# Patient Record
Sex: Male | Born: 1945 | Race: Black or African American | Hispanic: No | Marital: Married | Smoking: Former smoker
Health system: Southern US, Community
[De-identification: ages and names within clinical notes are randomized; demographics above are authoritative.]

## PROBLEM LIST (undated history)

## (undated) DIAGNOSIS — M1990 Unspecified osteoarthritis, unspecified site: Secondary | ICD-10-CM

## (undated) DIAGNOSIS — E039 Hypothyroidism, unspecified: Secondary | ICD-10-CM

## (undated) DIAGNOSIS — E7800 Pure hypercholesterolemia, unspecified: Secondary | ICD-10-CM

## (undated) DIAGNOSIS — I700 Atherosclerosis of aorta: Secondary | ICD-10-CM

## (undated) DIAGNOSIS — I10 Essential (primary) hypertension: Secondary | ICD-10-CM

## (undated) DIAGNOSIS — Z9889 Other specified postprocedural states: Secondary | ICD-10-CM

## (undated) DIAGNOSIS — E782 Mixed hyperlipidemia: Secondary | ICD-10-CM

## (undated) DIAGNOSIS — N183 Chronic kidney disease, stage 3 unspecified: Secondary | ICD-10-CM

## (undated) DIAGNOSIS — F419 Anxiety disorder, unspecified: Secondary | ICD-10-CM

## (undated) DIAGNOSIS — M47812 Spondylosis without myelopathy or radiculopathy, cervical region: Secondary | ICD-10-CM

## (undated) DIAGNOSIS — E119 Type 2 diabetes mellitus without complications: Secondary | ICD-10-CM

## (undated) DIAGNOSIS — R112 Nausea with vomiting, unspecified: Secondary | ICD-10-CM

## (undated) DIAGNOSIS — N400 Enlarged prostate without lower urinary tract symptoms: Secondary | ICD-10-CM

## (undated) HISTORY — DX: Type 2 diabetes mellitus without complications: E11.9

## (undated) HISTORY — DX: Anxiety disorder, unspecified: F41.9

## (undated) HISTORY — DX: Essential (primary) hypertension: I10

## (undated) HISTORY — DX: Unspecified osteoarthritis, unspecified site: M19.90

## (undated) HISTORY — DX: Atherosclerosis of aorta: I70.0

## (undated) HISTORY — DX: Hypothyroidism, unspecified: E03.9

## (undated) HISTORY — DX: Pure hypercholesterolemia, unspecified: E78.00

## (undated) HISTORY — DX: Benign prostatic hyperplasia without lower urinary tract symptoms: N40.0

## (undated) HISTORY — DX: Spondylosis without myelopathy or radiculopathy, cervical region: M47.812

## (undated) HISTORY — DX: Chronic kidney disease, stage 3 unspecified: N18.30

## (undated) HISTORY — PX: KNEE SURGERY: SHX244

## (undated) HISTORY — DX: Mixed hyperlipidemia: E78.2

## (undated) HISTORY — PX: BACK SURGERY: SHX140

---

## 2000-06-30 ENCOUNTER — Ambulatory Visit (HOSPITAL_COMMUNITY): Admission: RE | Admit: 2000-06-30 | Discharge: 2000-06-30 | Payer: Self-pay | Admitting: General Surgery

## 2001-03-02 ENCOUNTER — Encounter: Payer: Self-pay | Admitting: Internal Medicine

## 2001-03-02 ENCOUNTER — Ambulatory Visit (HOSPITAL_COMMUNITY): Admission: RE | Admit: 2001-03-02 | Discharge: 2001-03-02 | Payer: Self-pay | Admitting: Internal Medicine

## 2001-05-05 ENCOUNTER — Encounter: Payer: Self-pay | Admitting: *Deleted

## 2001-05-05 ENCOUNTER — Emergency Department (HOSPITAL_COMMUNITY): Admission: EM | Admit: 2001-05-05 | Discharge: 2001-05-05 | Payer: Self-pay | Admitting: *Deleted

## 2001-07-18 ENCOUNTER — Ambulatory Visit (HOSPITAL_COMMUNITY): Admission: RE | Admit: 2001-07-18 | Discharge: 2001-07-18 | Payer: Self-pay | Admitting: Internal Medicine

## 2001-07-18 ENCOUNTER — Encounter: Payer: Self-pay | Admitting: Internal Medicine

## 2003-11-22 ENCOUNTER — Ambulatory Visit (HOSPITAL_COMMUNITY): Admission: RE | Admit: 2003-11-22 | Discharge: 2003-11-22 | Payer: Self-pay | Admitting: Internal Medicine

## 2004-02-25 ENCOUNTER — Inpatient Hospital Stay (HOSPITAL_COMMUNITY): Admission: RE | Admit: 2004-02-25 | Discharge: 2004-02-28 | Payer: Self-pay | Admitting: Neurosurgery

## 2004-04-20 ENCOUNTER — Encounter (HOSPITAL_COMMUNITY): Admission: RE | Admit: 2004-04-20 | Discharge: 2004-05-20 | Payer: Self-pay | Admitting: Neurosurgery

## 2004-06-17 ENCOUNTER — Ambulatory Visit (HOSPITAL_COMMUNITY): Admission: RE | Admit: 2004-06-17 | Discharge: 2004-06-17 | Payer: Self-pay | Admitting: General Surgery

## 2005-06-23 ENCOUNTER — Ambulatory Visit (HOSPITAL_COMMUNITY): Admission: RE | Admit: 2005-06-23 | Discharge: 2005-06-23 | Payer: Self-pay | Admitting: Internal Medicine

## 2010-09-23 ENCOUNTER — Other Ambulatory Visit (HOSPITAL_COMMUNITY): Payer: Self-pay | Admitting: Internal Medicine

## 2010-09-23 DIAGNOSIS — M549 Dorsalgia, unspecified: Secondary | ICD-10-CM

## 2010-09-28 ENCOUNTER — Other Ambulatory Visit (HOSPITAL_COMMUNITY): Payer: Self-pay

## 2010-09-29 ENCOUNTER — Ambulatory Visit (HOSPITAL_COMMUNITY)
Admission: RE | Admit: 2010-09-29 | Discharge: 2010-09-29 | Disposition: A | Discharge status: Home / Self Care | Payer: Medicare Other | Source: Ambulatory Visit | Attending: Internal Medicine | Admitting: Internal Medicine

## 2010-09-29 DIAGNOSIS — M5126 Other intervertebral disc displacement, lumbar region: Secondary | ICD-10-CM | POA: Insufficient documentation

## 2010-09-29 DIAGNOSIS — M545 Low back pain, unspecified: Secondary | ICD-10-CM | POA: Insufficient documentation

## 2010-09-29 DIAGNOSIS — M549 Dorsalgia, unspecified: Secondary | ICD-10-CM

## 2014-11-20 ENCOUNTER — Other Ambulatory Visit (HOSPITAL_COMMUNITY): Payer: Self-pay | Admitting: Internal Medicine

## 2014-11-20 DIAGNOSIS — R109 Unspecified abdominal pain: Secondary | ICD-10-CM

## 2014-12-16 ENCOUNTER — Encounter (INDEPENDENT_AMBULATORY_CARE_PROVIDER_SITE_OTHER): Payer: Self-pay | Admitting: *Deleted

## 2015-01-22 ENCOUNTER — Encounter (INDEPENDENT_AMBULATORY_CARE_PROVIDER_SITE_OTHER): Payer: Self-pay | Admitting: Internal Medicine

## 2015-01-22 ENCOUNTER — Other Ambulatory Visit (INDEPENDENT_AMBULATORY_CARE_PROVIDER_SITE_OTHER): Payer: Self-pay | Admitting: Internal Medicine

## 2015-01-22 ENCOUNTER — Ambulatory Visit (INDEPENDENT_AMBULATORY_CARE_PROVIDER_SITE_OTHER): Payer: Medicare Other | Admitting: Internal Medicine

## 2015-01-22 ENCOUNTER — Telehealth (INDEPENDENT_AMBULATORY_CARE_PROVIDER_SITE_OTHER): Payer: Self-pay | Admitting: *Deleted

## 2015-01-22 DIAGNOSIS — Z1211 Encounter for screening for malignant neoplasm of colon: Secondary | ICD-10-CM | POA: Diagnosis not present

## 2015-01-22 DIAGNOSIS — I10 Essential (primary) hypertension: Secondary | ICD-10-CM | POA: Insufficient documentation

## 2015-01-22 DIAGNOSIS — R109 Unspecified abdominal pain: Secondary | ICD-10-CM

## 2015-01-22 DIAGNOSIS — E7800 Pure hypercholesterolemia, unspecified: Secondary | ICD-10-CM | POA: Insufficient documentation

## 2015-01-22 NOTE — Telephone Encounter (Signed)
Patient called in and his allergies are: Allegra, Cephalexin, Keflex

## 2015-01-22 NOTE — Progress Notes (Addendum)
Subjective:    Patient ID: Joseph Wolf, male    DOB: 12/26/1945, 70 y.o.   MRN: [id number]  Patient is allergic to Allegra and Keflex  HPI Referred by Dr. Gerarda Fraction [facility]) for abdominal pain/colonoscopy. Patient has never had a colonoscopy in the past.  He tells me two months ago he had lower abdominal pain and saw Dr Gerarda Fraction. He says he underwent a CT scan and was negative for diverticulitis. He was empirically treated he thinks with Cipro and Flagyl. The pain resolved. The pain lasted for about a week. He says he does have problems with constipation and takes Linzess. He usually has a BM every three days. No melena or BRRB. Appetite is good. No weight loss. He has acid reflux and is usually controlled with Omeprazole.   His last colonoscopy was in 2006 by Dr. Arnoldo Morale for hx of colonic polyps.  Small non bleeding internal hemorrhoids were present. Normal colon exam. Repeat in 10 yrs.     Review of Systems Past Medical History  Diagnosis Date  . High cholesterol   . Hypertension     Past Surgical History  Procedure Laterality Date  . Back surgery      2014. Has had four back surgeryes total  . Knee surgery      Left arthro    Not on File  No current outpatient prescriptions on file prior to visit.   No current facility-administered medications on file prior to visit.   No current outpatient prescriptions on file prior to visit.   No current facility-administered medications on file prior to visit.   Current Outpatient Prescriptions  Medication Sig Dispense Refill  . acetaminophen (TYLENOL) 325 MG tablet Take 650 mg by mouth every 6 (six) hours as needed.    Marland Kitchen amLODipine (NORVASC) 10 MG tablet Take 10 mg by mouth daily.    Marland Kitchen atenolol-chlorthalidone (TENORETIC) 100-25 MG tablet Take 1 tablet by mouth daily.    . cholecalciferol (VITAMIN D) 400 units TABS tablet Take 1,000 Units by mouth.    . cloNIDine (CATAPRES) 0.2 MG tablet Take 0.2 mg by mouth 2 (two) times  daily.    . Linaclotide (LINZESS) 145 MCG CAPS capsule Take 145 mcg by mouth daily.    . metFORMIN (GLUCOPHAGE) 850 MG tablet Take 850 mg by mouth 2 (two) times daily with a meal.    . omeprazole (PRILOSEC) 20 MG capsule Take 20 mg by mouth daily.    . potassium chloride (K-DUR) 10 MEQ tablet Take 20 mEq by mouth daily.    . rosuvastatin (CRESTOR) 20 MG tablet Take 20 mg by mouth daily.    . vitamin B-12 (CYANOCOBALAMIN) 1000 MCG tablet Take 1,000 mcg by mouth daily.     No current facility-administered medications for this visit.         Objective:   Physical Exam Blood pressure 112/76, pulse 60, temperature 98.1 F (36.7 C), height 5\' 9"  (1.753 m), weight 193 lb 3.2 oz (87.635 kg). Alert and oriented. Skin warm and dry. Oral mucosa is moist.   . Sclera anicteric, conjunctivae is pink. Thyroid not enlarged. No cervical lymphadenopathy. Lungs clear. Heart regular rate and rhythm.  Abdomen is soft. Bowel sounds are positive. No hepatomegaly. No abdominal masses felt. No tenderness.  No edema to lower extremities. Patient is alert and oriented.        Assessment & Plan:  Screening colonoscopy. The risks and benefits such as perforation, bleeding, and infection were reviewed with the  patient and is agreeable.

## 2015-01-22 NOTE — Patient Instructions (Signed)
The risks and benefits such as perforation, bleeding, and infection were reviewed with the patient and is agreeable. 

## 2015-01-22 NOTE — Telephone Encounter (Signed)
Patient needs trilyte 

## 2015-01-22 NOTE — Telephone Encounter (Signed)
noted 

## 2015-01-23 ENCOUNTER — Encounter (INDEPENDENT_AMBULATORY_CARE_PROVIDER_SITE_OTHER): Payer: Self-pay

## 2015-01-24 MED ORDER — PEG 3350-KCL-NA BICARB-NACL 420 G PO SOLR: 4000.0000 mL | Freq: Once | ORAL | Status: DC

## 2015-02-26 ENCOUNTER — Encounter (HOSPITAL_COMMUNITY)
Admission: RE | Disposition: A | Discharge status: Home / Self Care | Payer: Self-pay | Source: Ambulatory Visit | Attending: Internal Medicine

## 2015-02-26 ENCOUNTER — Ambulatory Visit (HOSPITAL_COMMUNITY)
Admission: RE | Admit: 2015-02-26 | Discharge: 2015-02-26 | Disposition: A | Discharge status: Home / Self Care | Payer: Medicare Other | Source: Ambulatory Visit | Attending: Internal Medicine | Admitting: Internal Medicine

## 2015-02-26 ENCOUNTER — Encounter (HOSPITAL_COMMUNITY): Payer: Self-pay

## 2015-02-26 DIAGNOSIS — E119 Type 2 diabetes mellitus without complications: Secondary | ICD-10-CM | POA: Insufficient documentation

## 2015-02-26 DIAGNOSIS — K649 Unspecified hemorrhoids: Secondary | ICD-10-CM | POA: Diagnosis not present

## 2015-02-26 DIAGNOSIS — Z7984 Long term (current) use of oral hypoglycemic drugs: Secondary | ICD-10-CM | POA: Diagnosis not present

## 2015-02-26 DIAGNOSIS — Z1211 Encounter for screening for malignant neoplasm of colon: Secondary | ICD-10-CM | POA: Insufficient documentation

## 2015-02-26 DIAGNOSIS — Z79899 Other long term (current) drug therapy: Secondary | ICD-10-CM | POA: Insufficient documentation

## 2015-02-26 DIAGNOSIS — K5730 Diverticulosis of large intestine without perforation or abscess without bleeding: Secondary | ICD-10-CM | POA: Diagnosis not present

## 2015-02-26 DIAGNOSIS — Q438 Other specified congenital malformations of intestine: Secondary | ICD-10-CM | POA: Insufficient documentation

## 2015-02-26 DIAGNOSIS — E7800 Pure hypercholesterolemia, unspecified: Secondary | ICD-10-CM | POA: Diagnosis not present

## 2015-02-26 DIAGNOSIS — Z87891 Personal history of nicotine dependence: Secondary | ICD-10-CM | POA: Diagnosis not present

## 2015-02-26 DIAGNOSIS — D122 Benign neoplasm of ascending colon: Secondary | ICD-10-CM | POA: Diagnosis not present

## 2015-02-26 DIAGNOSIS — K648 Other hemorrhoids: Secondary | ICD-10-CM | POA: Diagnosis not present

## 2015-02-26 DIAGNOSIS — I10 Essential (primary) hypertension: Secondary | ICD-10-CM | POA: Diagnosis not present

## 2015-02-26 HISTORY — PX: COLONOSCOPY: SHX5424

## 2015-02-26 HISTORY — DX: Other specified postprocedural states: R11.2

## 2015-02-26 HISTORY — DX: Other specified postprocedural states: Z98.890

## 2015-02-26 LAB — GLUCOSE, CAPILLARY: Glucose-Capillary: 145 mg/dL — ABNORMAL HIGH (ref 65–99)

## 2015-02-26 SURGERY — COLONOSCOPY
Anesthesia: Moderate Sedation

## 2015-02-26 MED ORDER — SODIUM CHLORIDE 0.9 % IV SOLN
INTRAVENOUS | Status: DC
  Administered 2015-02-26: 12:00:00 via INTRAVENOUS

## 2015-02-26 MED ORDER — MEPERIDINE HCL 50 MG/ML IJ SOLN
INTRAMUSCULAR | Status: AC
  Filled 2015-02-26: qty 1

## 2015-02-26 MED ORDER — MEPERIDINE HCL 50 MG/ML IJ SOLN
INTRAMUSCULAR | Status: DC | PRN
  Administered 2015-02-26 (×2): 25 mg via INTRAVENOUS

## 2015-02-26 MED ORDER — BENEFIBER DRINK MIX PO PACK: 4.0000 g | PACK | Freq: Every day | ORAL | Status: DC

## 2015-02-26 MED ORDER — MIDAZOLAM HCL 5 MG/5ML IJ SOLN
INTRAMUSCULAR | Status: AC
  Filled 2015-02-26: qty 10

## 2015-02-26 MED ORDER — MIDAZOLAM HCL 5 MG/5ML IJ SOLN
INTRAMUSCULAR | Status: DC | PRN
  Administered 2015-02-26 (×5): 2 mg via INTRAVENOUS

## 2015-02-26 MED ORDER — SIMETHICONE 40 MG/0.6ML PO SUSP
ORAL | Status: DC | PRN
  Administered 2015-02-26: 13:00:00

## 2015-02-26 NOTE — H&P (Addendum)
Joseph Wolf is an 70 y.o. male.   Chief Complaint:  Patient is  here for colonoscopy. HPI:   Patient is 70 year old African  American male who is here for screening colonoscopy. Last exam was in 2006. He denies rectal bleeding recent change in bowel habits. He has history of constipation and is on Linzess. Since he was treated for diverticulitis about 4 months ago and has fully recovered. Family history is negative for CRC.  Past Medical History  Diagnosis Date  . High cholesterol   . Hypertension   . PONV (postoperative nausea and vomiting)        DM  Past Surgical History  Procedure Laterality Date  . Back surgery      2014. Has had four back surgeryes total  . Knee surgery      Left arthro    Family History  Problem Relation Age of Onset  . Ulcerative colitis Paternal Grandmother    Social History:  reports that he has quit smoking. He does not have any smokeless tobacco history on file. He reports that he does not drink alcohol or use illicit drugs.  Allergies:  Allergies  Allergen Reactions  . Allegra [Fexofenadine]   . Keflex [Cephalexin]     Medications Prior to Admission  Medication Sig Dispense Refill  . acetaminophen (TYLENOL) 325 MG tablet Take 650 mg by mouth every 6 (six) hours as needed.    . ALPRAZolam (XANAX) 0.5 MG tablet Take 0.5 mg by mouth 3 (three) times daily as needed for anxiety.     Marland Kitchen amLODipine (NORVASC) 10 MG tablet Take 10 mg by mouth daily.    Marland Kitchen atenolol-chlorthalidone (TENORETIC) 100-25 MG tablet Take 1 tablet by mouth daily.    . cholecalciferol (VITAMIN D) 400 units TABS tablet Take 1,000 Units by mouth.    . cloNIDine (CATAPRES) 0.2 MG tablet Take 0.2 mg by mouth 2 (two) times daily.    . Linaclotide (LINZESS) 145 MCG CAPS capsule Take 145 mcg by mouth daily.    . metFORMIN (GLUCOPHAGE) 850 MG tablet Take 850 mg by mouth 2 (two) times daily with a meal.    . omeprazole (PRILOSEC) 20 MG capsule Take 20 mg by mouth daily.    . polyethylene  glycol-electrolytes (NULYTELY/GOLYTELY) 420 g solution Take 4,000 mLs by mouth once. 4000 mL 0  . potassium chloride (K-DUR) 10 MEQ tablet Take 20 mEq by mouth daily.    . potassium chloride SA (K-DUR,KLOR-CON) 20 MEQ tablet TK 1 T PO QD  5  . rosuvastatin (CRESTOR) 20 MG tablet Take 20 mg by mouth daily.    . temazepam (RESTORIL) 30 MG capsule Take 30 mg by mouth at bedtime.     . vitamin B-12 (CYANOCOBALAMIN) 1000 MCG tablet Take 1,000 mcg by mouth daily.      No results found for this or any previous visit (from the past 48 hour(s)). No results found.  ROS  Blood pressure 161/90, pulse 67, temperature 98.1 F (36.7 C), temperature source Oral, resp. rate 17, height 5\' 9"  (1.753 m), weight 193 lb (87.544 kg), SpO2 100 %. Physical Exam  Constitutional: He appears well-developed and well-nourished.  HENT:  Mouth/Throat: Oropharynx is clear and moist.  Eyes: Conjunctivae are normal. No scleral icterus.  Neck: No thyromegaly present.  Cardiovascular: Normal rate, regular rhythm and normal heart sounds.   No murmur heard. Respiratory: Effort normal and breath sounds normal.  GI: Soft. He exhibits no distension and no mass. There is no tenderness.  Musculoskeletal: He exhibits no edema.  Lymphadenopathy:    He has no cervical adenopathy.  Neurological: He is alert.  Skin: Skin is warm and dry.     Assessment/Plan  Average risk screening colonoscopy.  Rogene Houston, MD 02/26/2015, 1:01 PM

## 2015-02-26 NOTE — Discharge Instructions (Signed)
Colonoscopy, Care After °Refer to this sheet in the next few weeks. These instructions provide you with information on caring for yourself after your procedure. Your health care provider may also give you more specific instructions. Your treatment has been planned according to current medical practices, but problems sometimes occur. Call your health care provider if you have any problems or questions after your procedure. °WHAT TO EXPECT AFTER THE PROCEDURE  °After your procedure, it is typical to have the following: °· A small amount of blood in your stool. °· Moderate amounts of gas and mild abdominal cramping or bloating. °HOME CARE INSTRUCTIONS °· Do not drive, operate machinery, or sign important documents for 24 hours. °· You may shower and resume your regular physical activities, but move at a slower pace for the first 24 hours. °· Take frequent rest periods for the first 24 hours. °· Walk around or put a warm pack on your abdomen to help reduce abdominal cramping and bloating. °· Drink enough fluids to keep your urine clear or pale yellow. °· You may resume your normal diet as instructed by your health care provider. Avoid heavy or fried foods that are hard to digest. °· Avoid drinking alcohol for 24 hours or as instructed by your health care provider. °· Only take over-the-counter or prescription medicines as directed by your health care provider. °· If a tissue sample (biopsy) was taken during your procedure: °¨ Do not take aspirin or blood thinners for 7 days, or as instructed by your health care provider. °¨ Do not drink alcohol for 7 days, or as instructed by your health care provider. °¨ Eat soft foods for the first 24 hours. °SEEK MEDICAL CARE IF: °You have persistent spotting of blood in your stool 2-3 days after the procedure. °SEEK IMMEDIATE MEDICAL CARE IF: °· You have more than a small spotting of blood in your stool. °· You pass large blood clots in your stool. °· Your abdomen is swollen  (distended). °· You have nausea or vomiting. °· You have a fever. °· You have increasing abdominal pain that is not relieved with medicine. °  °This information is not intended to replace advice given to you by your health care provider. Make sure you discuss any questions you have with your health care provider. °  °Document Released: 08/19/2003 Document Revised: 10/25/2012 Document Reviewed: 09/11/2012 °Elsevier Interactive Patient Education ©2016 [employer] ° °

## 2015-02-26 NOTE — Op Note (Signed)
COLONOSCOPY PROCEDURE REPORT  PATIENT:  JESPER GASSNER  MR#:  [id number] Birthdate:  [birth year], 70 y.o., male Endoscopist:  Dr. Rogene Houston, MD Referred By:  Dr. Glo Herring, MD Procedure Date: 02/26/2015  Procedure:   Colonoscopy  Indications: Patient is 70 year old African male who is undergoing average risk screening colonoscopy. Last exam was in 2006. Patient says he was treated for diverticulitis about 4 months ago. Presently he is asymptomatic other than chronic constipation for which she is taking Linzess.  Informed Consent:  The procedure and risks were reviewed with the patient and informed consent was obtained.  Medications:  Demerol 50 mg IV Versed 10 mg IV  First dose administered at 1305 Last dose administered at 61  Description of procedure:  After a digital rectal exam was performed, that colonoscope was advanced from the anus through the rectum and colon to the area of the cecum, ileocecal valve and appendiceal orifice. The cecum was deeply intubated. These structures were well-seen and photographed for the record. From the level of the cecum and ileocecal valve, the scope was slowly and cautiously withdrawn. The mucosal surfaces were carefully surveyed utilizing scope tip to flexion to facilitate fold flattening as needed. The scope was pulled down into the rectum where a thorough exam including retroflexion was performed.  Findings:   Prep satisfactory. Redundant colon with scattered diverticula at sigmoid colon and hepatic flexure. 4 mm polyp cold snared from ascending colon. Normal rectal mucosa. Prominent hemorrhoids noted below the dentate line.    Therapeutic/Diagnostic Maneuvers Performed:  See above  Complications:  None  EBL:  Minimal  Cecal Withdrawal Time:  13 minutes  Impression:  Examination performed to cecum. Small polyp cold snare from ascending colon. Scattered diverticula at ascending and sigmoid colon. External  hemorrhoids.  Recommendations:  Standard instructions given. Benefiber 4 g by mouth daily at bedtime. I will contact patient with biopsy results and further recommendations.  Phallon Haydu U  02/26/2015 2:40 PM  CC: Dr. Glo Herring., MD & Dr. Rayne Du ref. provider found

## 2015-03-05 ENCOUNTER — Encounter (HOSPITAL_COMMUNITY): Payer: Self-pay | Admitting: Internal Medicine

## 2018-08-07 ENCOUNTER — Other Ambulatory Visit: Payer: Medicare Other

## 2018-08-07 DIAGNOSIS — Z20822 Contact with and (suspected) exposure to covid-19: Secondary | ICD-10-CM

## 2018-08-10 LAB — NOVEL CORONAVIRUS, NAA: SARS-CoV-2, NAA: NOT DETECTED

## 2018-11-27 ENCOUNTER — Other Ambulatory Visit: Payer: Self-pay

## 2018-11-27 ENCOUNTER — Encounter (HOSPITAL_COMMUNITY): Payer: Self-pay | Admitting: Emergency Medicine

## 2018-11-27 ENCOUNTER — Emergency Department (HOSPITAL_COMMUNITY): Payer: Medicare Other

## 2018-11-27 ENCOUNTER — Inpatient Hospital Stay (HOSPITAL_COMMUNITY)
Admission: EM | Admit: 2018-11-27 | Discharge: 2018-12-01 | DRG: 645 | Disposition: A | Discharge status: Home Health | Payer: Medicare Other | Attending: Internal Medicine | Admitting: Internal Medicine

## 2018-11-27 ENCOUNTER — Inpatient Hospital Stay: Payer: Self-pay

## 2018-11-27 DIAGNOSIS — K219 Gastro-esophageal reflux disease without esophagitis: Secondary | ICD-10-CM | POA: Diagnosis present

## 2018-11-27 DIAGNOSIS — E1165 Type 2 diabetes mellitus with hyperglycemia: Secondary | ICD-10-CM | POA: Diagnosis present

## 2018-11-27 DIAGNOSIS — Z20828 Contact with and (suspected) exposure to other viral communicable diseases: Secondary | ICD-10-CM | POA: Diagnosis present

## 2018-11-27 DIAGNOSIS — T380X5A Adverse effect of glucocorticoids and synthetic analogues, initial encounter: Secondary | ICD-10-CM | POA: Diagnosis present

## 2018-11-27 DIAGNOSIS — E871 Hypo-osmolality and hyponatremia: Secondary | ICD-10-CM

## 2018-11-27 DIAGNOSIS — Z7984 Long term (current) use of oral hypoglycemic drugs: Secondary | ICD-10-CM

## 2018-11-27 DIAGNOSIS — E785 Hyperlipidemia, unspecified: Secondary | ICD-10-CM | POA: Diagnosis present

## 2018-11-27 DIAGNOSIS — Z87891 Personal history of nicotine dependence: Secondary | ICD-10-CM | POA: Diagnosis not present

## 2018-11-27 DIAGNOSIS — E7800 Pure hypercholesterolemia, unspecified: Secondary | ICD-10-CM | POA: Diagnosis present

## 2018-11-27 DIAGNOSIS — R945 Abnormal results of liver function studies: Secondary | ICD-10-CM | POA: Diagnosis not present

## 2018-11-27 DIAGNOSIS — E222 Syndrome of inappropriate secretion of antidiuretic hormone: Principal | ICD-10-CM | POA: Diagnosis present

## 2018-11-27 DIAGNOSIS — Z79899 Other long term (current) drug therapy: Secondary | ICD-10-CM

## 2018-11-27 DIAGNOSIS — E876 Hypokalemia: Secondary | ICD-10-CM | POA: Diagnosis present

## 2018-11-27 DIAGNOSIS — I10 Essential (primary) hypertension: Secondary | ICD-10-CM | POA: Diagnosis present

## 2018-11-27 DIAGNOSIS — R531 Weakness: Secondary | ICD-10-CM | POA: Diagnosis present

## 2018-11-27 DIAGNOSIS — Z888 Allergy status to other drugs, medicaments and biological substances status: Secondary | ICD-10-CM | POA: Diagnosis not present

## 2018-11-27 DIAGNOSIS — E1169 Type 2 diabetes mellitus with other specified complication: Secondary | ICD-10-CM

## 2018-11-27 DIAGNOSIS — B029 Zoster without complications: Secondary | ICD-10-CM

## 2018-11-27 DIAGNOSIS — E861 Hypovolemia: Secondary | ICD-10-CM | POA: Diagnosis present

## 2018-11-27 DIAGNOSIS — E119 Type 2 diabetes mellitus without complications: Secondary | ICD-10-CM

## 2018-11-27 DIAGNOSIS — Z8379 Family history of other diseases of the digestive system: Secondary | ICD-10-CM | POA: Diagnosis not present

## 2018-11-27 DIAGNOSIS — Z79891 Long term (current) use of opiate analgesic: Secondary | ICD-10-CM | POA: Diagnosis not present

## 2018-11-27 LAB — CBC
HCT: 37.3 % — ABNORMAL LOW (ref 39.0–52.0)
Hemoglobin: 13.9 g/dL (ref 13.0–17.0)
MCH: 30.9 pg (ref 26.0–34.0)
MCHC: 37.3 g/dL — ABNORMAL HIGH (ref 30.0–36.0)
MCV: 82.9 fL (ref 80.0–100.0)
Platelets: 278 10*3/uL (ref 150–400)
RBC: 4.5 MIL/uL (ref 4.22–5.81)
RDW: 11.3 % — ABNORMAL LOW (ref 11.5–15.5)
WBC: 17 10*3/uL — ABNORMAL HIGH (ref 4.0–10.5)
nRBC: 0 % (ref 0.0–0.2)

## 2018-11-27 LAB — URINALYSIS, ROUTINE W REFLEX MICROSCOPIC
Bacteria, UA: NONE SEEN
Bilirubin Urine: NEGATIVE
Glucose, UA: 150 mg/dL — AB
Ketones, ur: NEGATIVE mg/dL
Leukocytes,Ua: NEGATIVE
Nitrite: NEGATIVE
Protein, ur: 100 mg/dL — AB
Specific Gravity, Urine: 1.006 (ref 1.005–1.030)
pH: 7 (ref 5.0–8.0)

## 2018-11-27 LAB — BASIC METABOLIC PANEL
Anion gap: 17 — ABNORMAL HIGH (ref 5–15)
BUN: 14 mg/dL (ref 8–23)
CO2: 22 mmol/L (ref 22–32)
Calcium: 8.5 mg/dL — ABNORMAL LOW (ref 8.9–10.3)
Chloride: 68 mmol/L — ABNORMAL LOW (ref 98–111)
Creatinine, Ser: 0.97 mg/dL (ref 0.61–1.24)
GFR calc Af Amer: >60 mL/min (ref >60)
GFR calc non Af Amer: >60 mL/min (ref >60)
Glucose, Bld: 227 mg/dL — ABNORMAL HIGH (ref 70–99)
Potassium: 3.1 mmol/L — ABNORMAL LOW (ref 3.5–5.1)
Sodium: 107 mmol/L — CL (ref 135–145)

## 2018-11-27 LAB — COMPREHENSIVE METABOLIC PANEL
ALT: 53 U/L — ABNORMAL HIGH (ref 0–44)
AST: 146 U/L — ABNORMAL HIGH (ref 15–41)
Albumin: 4.1 g/dL (ref 3.5–5.0)
Alkaline Phosphatase: 58 U/L (ref 38–126)
Anion gap: 16 — ABNORMAL HIGH (ref 5–15)
BUN: 15 mg/dL (ref 8–23)
CO2: 25 mmol/L (ref 22–32)
Calcium: 8.9 mg/dL (ref 8.9–10.3)
Chloride: 66 mmol/L — ABNORMAL LOW (ref 98–111)
Creatinine, Ser: 0.85 mg/dL (ref 0.61–1.24)
GFR calc Af Amer: >60 mL/min (ref >60)
GFR calc non Af Amer: >60 mL/min (ref >60)
Glucose, Bld: 268 mg/dL — ABNORMAL HIGH (ref 70–99)
Potassium: 2.7 mmol/L — CL (ref 3.5–5.1)
Sodium: 107 mmol/L — CL (ref 135–145)
Total Bilirubin: 1.8 mg/dL — ABNORMAL HIGH (ref 0.3–1.2)
Total Protein: 7.3 g/dL (ref 6.5–8.1)

## 2018-11-27 LAB — HEMOGLOBIN A1C
Hgb A1c MFr Bld: 6.8 % — ABNORMAL HIGH (ref 4.8–5.6)
Mean Plasma Glucose: 148.46 mg/dL

## 2018-11-27 LAB — CBG MONITORING, ED
Glucose-Capillary: 262 mg/dL — ABNORMAL HIGH (ref 70–99)
Glucose-Capillary: 214 mg/dL — ABNORMAL HIGH (ref 70–99)

## 2018-11-27 LAB — SODIUM, URINE, RANDOM: Sodium, Ur: 13 mmol/L

## 2018-11-27 LAB — SODIUM
Sodium: 111 mmol/L — CL (ref 135–145)
Sodium: 112 mmol/L — CL (ref 135–145)
Sodium: 113 mmol/L — CL (ref 135–145)
Sodium: 116 mmol/L — CL (ref 135–145)
Sodium: 116 mmol/L — CL (ref 135–145)

## 2018-11-27 LAB — OSMOLALITY: Osmolality: 235 mOsm/kg — CL (ref 275–295)

## 2018-11-27 LAB — TSH: TSH: 0.775 u[IU]/mL (ref 0.350–4.500)

## 2018-11-27 LAB — OSMOLALITY, URINE: Osmolality, Ur: 381 mOsm/kg (ref 300–900)

## 2018-11-27 LAB — MAGNESIUM: Magnesium: 1.5 mg/dL — ABNORMAL LOW (ref 1.7–2.4)

## 2018-11-27 LAB — LIPASE, BLOOD: Lipase: 19 U/L (ref 11–51)

## 2018-11-27 LAB — STREP PNEUMONIAE URINARY ANTIGEN: Strep Pneumo Urinary Antigen: NEGATIVE

## 2018-11-27 LAB — PROCALCITONIN: Procalcitonin: <0.1 ng/mL

## 2018-11-27 MED ORDER — SODIUM CHLORIDE 3 % IV SOLN
INTRAVENOUS | Status: DC
  Administered 2018-11-27: 30 mL/h via INTRAVENOUS
  Filled 2018-11-27 (×3): qty 500

## 2018-11-27 MED ORDER — CLONIDINE HCL 0.2 MG PO TABS
0.2000 mg | ORAL_TABLET | Freq: Two times a day (BID) | ORAL | Status: DC
  Administered 2018-11-27 – 2018-12-01 (×8): 0.2 mg via ORAL
  Filled 2018-11-27 (×8): qty 1

## 2018-11-27 MED ORDER — ROSUVASTATIN CALCIUM 20 MG PO TABS
20.0000 mg | ORAL_TABLET | Freq: Every day | ORAL | Status: DC
  Administered 2018-11-28 – 2018-11-30 (×3): 20 mg via ORAL
  Filled 2018-11-27 (×5): qty 1

## 2018-11-27 MED ORDER — POTASSIUM CHLORIDE 10 MEQ/100ML IV SOLN
10.0000 meq | Freq: Once | INTRAVENOUS | Status: AC
  Administered 2018-11-27: 10 meq via INTRAVENOUS
  Filled 2018-11-27: qty 100

## 2018-11-27 MED ORDER — ONDANSETRON HCL 4 MG/2ML IJ SOLN
4.0000 mg | Freq: Once | INTRAMUSCULAR | Status: AC
  Administered 2018-11-27: 4 mg via INTRAVENOUS
  Filled 2018-11-27: qty 2

## 2018-11-27 MED ORDER — ENOXAPARIN SODIUM 40 MG/0.4ML ~~LOC~~ SOLN
40.0000 mg | SUBCUTANEOUS | Status: DC
  Administered 2018-11-27 – 2018-11-30 (×4): 40 mg via SUBCUTANEOUS
  Filled 2018-11-27 (×4): qty 0.4

## 2018-11-27 MED ORDER — ALPRAZOLAM 0.5 MG PO TABS
0.5000 mg | ORAL_TABLET | Freq: Three times a day (TID) | ORAL | Status: DC | PRN
  Administered 2018-11-29 – 2018-12-01 (×4): 0.5 mg via ORAL
  Filled 2018-11-27 (×4): qty 1

## 2018-11-27 MED ORDER — VITAMIN D 25 MCG (1000 UNIT) PO TABS
1000.0000 [IU] | ORAL_TABLET | Freq: Every day | ORAL | Status: DC
  Administered 2018-11-28 – 2018-12-01 (×4): 1000 [IU] via ORAL
  Filled 2018-11-27 (×12): qty 1

## 2018-11-27 MED ORDER — TEMAZEPAM 15 MG PO CAPS
30.0000 mg | ORAL_CAPSULE | Freq: Every day | ORAL | Status: DC
  Administered 2018-11-27 – 2018-11-30 (×4): 30 mg via ORAL
  Filled 2018-11-27 (×4): qty 2

## 2018-11-27 MED ORDER — PANTOPRAZOLE SODIUM 40 MG PO TBEC
40.0000 mg | DELAYED_RELEASE_TABLET | Freq: Every day | ORAL | Status: DC
  Administered 2018-11-27 – 2018-12-01 (×5): 40 mg via ORAL
  Filled 2018-11-27 (×5): qty 1

## 2018-11-27 MED ORDER — ONDANSETRON HCL 4 MG/2ML IJ SOLN: 4.0000 mg | Freq: Four times a day (QID) | INTRAMUSCULAR | Status: DC | PRN

## 2018-11-27 MED ORDER — SODIUM CHLORIDE 0.9 % IV BOLUS
1000.0000 mL | Freq: Once | INTRAVENOUS | Status: AC
  Administered 2018-11-27: 1000 mL via INTRAVENOUS

## 2018-11-27 MED ORDER — ATENOLOL 25 MG PO TABS
100.0000 mg | ORAL_TABLET | Freq: Every day | ORAL | Status: DC
  Administered 2018-11-27 – 2018-12-01 (×5): 100 mg via ORAL
  Filled 2018-11-27 (×5): qty 4

## 2018-11-27 MED ORDER — SODIUM CHLORIDE 0.9 % IV SOLN
500.0000 mg | INTRAVENOUS | Status: DC
  Administered 2018-11-27 – 2018-11-28 (×2): 500 mg via INTRAVENOUS
  Filled 2018-11-27 (×2): qty 500

## 2018-11-27 MED ORDER — POTASSIUM CHLORIDE 10 MEQ/100ML IV SOLN
10.0000 meq | INTRAVENOUS | Status: AC
  Administered 2018-11-27 (×4): 10 meq via INTRAVENOUS
  Filled 2018-11-27 (×4): qty 100

## 2018-11-27 MED ORDER — INSULIN ASPART 100 UNIT/ML ~~LOC~~ SOLN
0.0000 [IU] | Freq: Three times a day (TID) | SUBCUTANEOUS | Status: DC
  Administered 2018-11-28 – 2018-11-29 (×3): 1 [IU] via SUBCUTANEOUS
  Administered 2018-11-29: 2 [IU] via SUBCUTANEOUS
  Administered 2018-11-30 (×2): 1 [IU] via SUBCUTANEOUS
  Administered 2018-11-30: 3 [IU] via SUBCUTANEOUS
  Administered 2018-12-01: 5 [IU] via SUBCUTANEOUS
  Administered 2018-12-01: 2 [IU] via SUBCUTANEOUS

## 2018-11-27 MED ORDER — AMLODIPINE BESYLATE 5 MG PO TABS
10.0000 mg | ORAL_TABLET | Freq: Every day | ORAL | Status: DC
  Administered 2018-11-28 – 2018-12-01 (×4): 10 mg via ORAL
  Filled 2018-11-27 (×4): qty 2

## 2018-11-27 MED ORDER — ACETAMINOPHEN 325 MG PO TABS: 650.0000 mg | ORAL_TABLET | Freq: Four times a day (QID) | ORAL | Status: DC | PRN

## 2018-11-27 MED ORDER — HYDROCODONE-ACETAMINOPHEN 10-325 MG PO TABS
1.0000 | ORAL_TABLET | Freq: Four times a day (QID) | ORAL | Status: DC | PRN
  Administered 2018-11-28: 21:00:00 1 via ORAL
  Filled 2018-11-27: qty 1

## 2018-11-27 MED ORDER — POTASSIUM CHLORIDE CRYS ER 20 MEQ PO TBCR
40.0000 meq | EXTENDED_RELEASE_TABLET | Freq: Once | ORAL | Status: AC
  Administered 2018-11-27: 40 meq via ORAL
  Filled 2018-11-27: qty 2

## 2018-11-27 MED ORDER — SODIUM CHLORIDE 0.9 % IV SOLN
1.0000 g | INTRAVENOUS | Status: DC
  Administered 2018-11-27 – 2018-11-28 (×2): 1 g via INTRAVENOUS
  Filled 2018-11-27 (×2): qty 10

## 2018-11-27 MED ORDER — VALACYCLOVIR HCL 500 MG PO TABS
1000.0000 mg | ORAL_TABLET | Freq: Three times a day (TID) | ORAL | Status: DC
  Administered 2018-11-27 – 2018-12-01 (×12): 1000 mg via ORAL
  Filled 2018-11-27 (×14): qty 2

## 2018-11-27 MED ORDER — SODIUM CHLORIDE 0.9 % IV SOLN
INTRAVENOUS | Status: DC | PRN
  Administered 2018-11-27: 500 mL via INTRAVENOUS

## 2018-11-27 MED ORDER — INSULIN ASPART 100 UNIT/ML ~~LOC~~ SOLN
0.0000 [IU] | Freq: Every day | SUBCUTANEOUS | Status: DC
  Administered 2018-11-27 – 2018-11-30 (×2): 2 [IU] via SUBCUTANEOUS
  Filled 2018-11-27: qty 1

## 2018-11-27 MED ORDER — VITAMIN B-12 1000 MCG PO TABS
1000.0000 ug | ORAL_TABLET | Freq: Every day | ORAL | Status: DC
  Administered 2018-11-28 – 2018-12-01 (×4): 1000 ug via ORAL
  Filled 2018-11-27 (×7): qty 1

## 2018-11-27 MED ORDER — ONDANSETRON HCL 4 MG PO TABS: 4.0000 mg | ORAL_TABLET | Freq: Four times a day (QID) | ORAL | Status: DC | PRN

## 2018-11-27 MED ORDER — LINACLOTIDE 145 MCG PO CAPS
145.0000 ug | ORAL_CAPSULE | Freq: Every day | ORAL | Status: DC
  Administered 2018-11-28 – 2018-12-01 (×4): 145 ug via ORAL
  Filled 2018-11-27 (×7): qty 1

## 2018-11-27 MED ORDER — ACETAMINOPHEN 650 MG RE SUPP: 650.0000 mg | Freq: Four times a day (QID) | RECTAL | Status: DC | PRN

## 2018-11-27 MED ORDER — BENZONATATE 100 MG PO CAPS: 100.0000 mg | ORAL_CAPSULE | Freq: Four times a day (QID) | ORAL | Status: DC | PRN

## 2018-11-27 NOTE — ED Notes (Signed)
Pt with emesis last night but with nausea many days.  LBM the other day and normal per pt.

## 2018-11-27 NOTE — ED Notes (Addendum)
CRITICAL VALUE ALERT  Critical Value:  Sodium 116  Date & Time Notied:  11/27/2018 2325  Provider Notified: [email or web address]  Orders Received/Actions taken: keep hypertonic fluids at 30mg /hr

## 2018-11-27 NOTE — ED Notes (Signed)
CRITICAL VALUE ALERT  Critical Value:  Sodium 107  Date & Time Notied:  11/27/18 1525  Provider Notified: Dr. Manuella Ghazi  Orders Received/Actions taken: MD notified

## 2018-11-27 NOTE — ED Notes (Signed)
ED Provider at bedside. 

## 2018-11-27 NOTE — H&P (Addendum)
History and Physical    Joseph Wolf [id number] DOB: 01/01/1946 DOA: 11/27/2018  PCP: Redmond School, MD   Patient coming from: Home  Chief Complaint: Melton Alar  HPI: Joseph Wolf is a 73 y.o. male with medical history significant for hypertension, dyslipidemia, and recent diagnosis of shingles who presented to the emergency department with weakness and dizziness over the last couple days as well as an episode of vomiting earlier today.  He was recently noted to have a rash over his scalp as well as some blistering on his lower lip and was diagnosed with shingles and started on Valtrex as well as steroid pack and given some narcotic pain medications.  He is good about taking his home medications which includes antihypertensives as well as a diuretic, chlorthalidone.  Since he has not been feeling too well he has been drinking some increased amount of fluids in the last 2 days.  This includes about 3 bottles of water as well as some [employer] and other beverages.  He continued to feel weak for which he presented to the ED.  He denies any fevers, chills, shortness of breath, chest pain or any other symptoms.   ED Course: Vital signs are stable, but patient is noted to be hypertensive.  His sodium is quite remarkable at 107 and potassium is 2.7.  He is also noted to have leukocytosis at 17,000.  I have discussed with his PCP office his prior sodium was last checked in 2017 and was noted to be 135 and there were no other recent labs available.  Glucose is 268.  EKG with sinus rhythm at 95 bpm.  Chest x-ray with left lower lobe infiltrate noted versus atelectasis.  He has been bolused 1 L normal saline and given 20 mEq of IV potassium in the ED.  Review of Systems: All others reviewed and otherwise negative except as noted above.  Past Medical History:  Diagnosis Date  . High cholesterol   . Hypertension   . PONV (postoperative nausea and vomiting)     Past Surgical History:   Procedure Laterality Date  . BACK SURGERY     2014. Has had four back surgeryes total  . COLONOSCOPY N/A 02/26/2015   Procedure: COLONOSCOPY;  Surgeon: Rogene Houston, MD;  Location: [facility];  Service: Endoscopy;  Laterality: N/A;  1:00  . KNEE SURGERY     Left arthro     reports that he has quit smoking. He does not have any smokeless tobacco history on file. He reports that he does not drink alcohol or use drugs.  Allergies  Allergen Reactions  . Allegra [Fexofenadine]   . Keflex [Cephalexin]     Family History  Problem Relation Age of Onset  . Ulcerative colitis Paternal Grandmother     Prior to Admission medications   Medication Sig Start Date End Date Taking? Authorizing Provider  acetaminophen (TYLENOL) 325 MG tablet Take 650 mg by mouth every 6 (six) hours as needed.   Yes [provider]  ALPRAZolam Duanne Moron) 0.5 MG tablet Take 0.5 mg by mouth 3 (three) times daily as needed for anxiety.  01/08/15  Yes [provider]  amLODipine (NORVASC) 10 MG tablet Take 10 mg by mouth daily.   Yes [provider]  atenolol-chlorthalidone (TENORETIC) 100-25 MG tablet Take 1 tablet by mouth daily.   Yes [provider]  benzonatate (TESSALON) 100 MG capsule Take 100 mg by mouth 4 (four) times daily as needed. 11/21/18  Yes  [provider]  cholecalciferol (VITAMIN D) 400 units TABS tablet Take 1,000 Units by mouth.   Yes [provider]  cloNIDine (CATAPRES) 0.2 MG tablet Take 0.2 mg by mouth 2 (two) times daily.   Yes [provider]  HYDROcodone-acetaminophen (NORCO) 10-325 MG tablet Take 1-2 tablets by mouth 4 (four) times daily as needed. 11/21/18  Yes [provider]  Linaclotide (LINZESS) 145 MCG CAPS capsule Take 145 mcg by mouth daily.   Yes [provider]  metFORMIN (GLUCOPHAGE) 850 MG tablet Take 850 mg by mouth 2 (two) times daily with a meal.   Yes [provider]  methylPREDNISolone  (MEDROL DOSEPAK) 4 MG TBPK tablet Take 1 tablet by mouth daily. 6,5,4,3,2,1 11/21/18  Yes [provider]  omeprazole (PRILOSEC) 20 MG capsule Take 20 mg by mouth daily.   Yes [provider]  potassium chloride SA (K-DUR,KLOR-CON) 20 MEQ tablet TK 1 T PO QD 01/23/15  Yes [provider]  rosuvastatin (CRESTOR) 20 MG tablet Take 20 mg by mouth daily.   Yes [provider]  temazepam (RESTORIL) 30 MG capsule Take 30 mg by mouth at bedtime.  12/20/14  Yes [provider]  valACYclovir (VALTREX) 1000 MG tablet Take 1,000 mg by mouth 3 (three) times daily. 11/21/18  Yes [provider]  vitamin B-12 (CYANOCOBALAMIN) 1000 MCG tablet Take 1,000 mcg by mouth daily.   Yes [provider]  Wheat Dextrin (BENEFIBER DRINK MIX) PACK Take 4 g by mouth at bedtime. Patient not taking: Reported on 11/27/2018 02/26/15   Rogene Houston, MD    Physical Exam: Vitals:   11/27/18 1250 11/27/18 1315 11/27/18 1345 11/27/18 1350  BP:      Pulse: 92 94 88 89  Resp: (!) 22 15 18  (!) 21  Temp:      TempSrc:      SpO2: 98% 100% 100% 100%  Weight:      Height:        Constitutional: NAD, calm, comfortable Vitals:   11/27/18 1250 11/27/18 1315 11/27/18 1345 11/27/18 1350  BP:      Pulse: 92 94 88 89  Resp: (!) 22 15 18  (!) 21  Temp:      TempSrc:      SpO2: 98% 100% 100% 100%  Weight:      Height:       Eyes: lids and conjunctivae normal ENMT: Mucous membranes are moist.  Neck: normal, supple Respiratory: clear to auscultation bilaterally. Normal respiratory effort. No accessory muscle use.  Cardiovascular: Regular rate and rhythm, no murmurs. No extremity edema. Abdomen: no tenderness, no distention. Bowel sounds positive.  Musculoskeletal:  No joint deformity upper and lower extremities.   Skin: no rashes, lesions, ulcers.  Psychiatric: Flat affect  Labs on Admission: I have personally reviewed following labs and imaging studies  CBC:  Recent Labs  Lab 11/27/18 1116  WBC 17.0*  HGB 13.9  HCT 37.3*  MCV 82.9  PLT 0000000   Basic Metabolic Panel: Recent Labs  Lab 11/27/18 1116  NA 107*  K 2.7*  CL 66*  CO2 25  GLUCOSE 268*  BUN 15  CREATININE 0.85  CALCIUM 8.9   GFR: Estimated Creatinine Clearance: 82.3 mL/min (by C-G formula based on SCr of 0.85 mg/dL). Liver Function Tests: Recent Labs  Lab 11/27/18 1116  AST 146*  ALT 53*  ALKPHOS 58  BILITOT 1.8*  PROT 7.3  ALBUMIN 4.1   Recent Labs  Lab 11/27/18 1116  LIPASE 19   No results for input(s): AMMONIA in the last 168 hours. Coagulation Profile: No results for input(s): INR, PROTIME in the last 168 hours. Cardiac Enzymes: No results for input(s): CKTOTAL, CKMB, CKMBINDEX, TROPONINI in the last 168 hours. BNP (last 3 results) No results for input(s): PROBNP in the last 8760 hours. HbA1C: No results for input(s): HGBA1C in the last 72 hours. CBG: Recent Labs  Lab 11/27/18 1327  GLUCAP 262*   Lipid Profile: No results for input(s): CHOL, HDL, LDLCALC, TRIG, CHOLHDL, LDLDIRECT in the last 72 hours. Thyroid Function Tests: No results for input(s): TSH, T4TOTAL, FREET4, T3FREE, THYROIDAB in the last 72 hours. Anemia Panel: No results for input(s): VITAMINB12, FOLATE, FERRITIN, TIBC, IRON, RETICCTPCT in the last 72 hours. Urine analysis: No results found for: COLORURINE, APPEARANCEUR, LABSPEC, Varina, GLUCOSEU, HGBUR, BILIRUBINUR, KETONESUR, PROTEINUR, UROBILINOGEN, NITRITE, LEUKOCYTESUR  Radiological Exams on Admission: Dg Chest Portable 1 View  Result Date: 11/27/2018 CLINICAL DATA:  Weakness with vomiting and dizziness EXAM: PORTABLE CHEST 1 VIEW COMPARISON:  None. FINDINGS: Lordotic projection. Negative for heart failure. Cervical ACDF fusion plate. Possible left lower lobe atelectasis/infiltrate. This is not well evaluated on the current projection. IMPRESSION: Possible left lower lobe atelectasis/infiltrate. If the patient has acute chest  symptoms, follow-up chest two-view recommended. Electronically Signed   By: Franchot Gallo M.D.   On: 11/27/2018 13:22    EKG: Independently reviewed.  Sinus rhythm 95 bpm.  Assessment/Plan Active Problems:   Acute hyponatremia    Severe symptomatic hyponatremia acute versus chronic -Could be secondary to ongoing chlorthalidone use and/or SIADH in the setting of acute pulmonary process -Check urine and serum osmolarity, urine sodium, and TSH -We will need close monitoring in ICU as patient is high risk for seizures and osmotic demyelination -3% hypertonic saline at 30 cc/h -Obtain baseline sodium after 1 L normal saline administration in ED -Follow serum sodium carefully every 1 hour with goal correction rate of 23meq/24 hours -Fluid restriction  Hypokalemia -Monitor on telemetry -20 mEq of IV potassium given in ED -We will supplement further with IV and p.o. potassium -Check magnesium  Possible community-acquired pneumonia -Maintain on Rocephin and azithromycin empirically -Follow CBC -Blood cultures x2 -Urine strep pneumonia and Legionella antigen -Check procalcitonin  GERD -PPI  Recent shingles diagnosis -Continue valacyclovir as well as pain medications  Hyperglycemia in setting of type 2 diabetes -Likely related to recent steroid taper -Hold home Metformin with ongoing nausea and vomiting -Maintain on SSI coverage -Check A1c  Hypertension -Currently with elevated levels -Plan to continue home medications with the exception of chlorthalidone  Dyslipidemia -Continue statin   DVT prophylaxis: Lovenox Code Status: Full Family Communication: Wife at bedside Disposition Plan: Admit to ICU for hypertonic saline administration and careful management. Consults called: Nephrology, discussed treatment strategy with Dr. Marval Regal Admission status: Inpatient, SDU   Duchess Armendarez Darleen Crocker DO [facility] Pager [phone]  If 7PM-7AM, please contact night-coverage  [email or web address] Password [email or web address]  11/27/2018, 2:15 PM

## 2018-11-27 NOTE — ED Triage Notes (Signed)
Pt states that last night he started vomiting and being weak. He states that he is not to steady on his feet. He is dizzy.

## 2018-11-27 NOTE — ED Notes (Signed)
Date and time results received: 11/27/18 1233 (use smartphrase ".now" to insert current time)  Test: Sodium/Potassium Critical Value: Sodium 107/Potassium 2.7  Name of Provider Notified: Dr Roderic Palau  Orders Received? Or Actions Taken?: NA

## 2018-11-27 NOTE — ED Provider Notes (Signed)
[facility] EMERGENCY DEPARTMENT Provider Note   CSN: [id number] Arrival date & time: 11/27/18  [id number]     History   Chief Complaint Chief Complaint  Patient presents with  . Emesis  . Weakness  . Dizziness    HPI Joseph Wolf is a 73 y.o. male.     Patient complains of weakness and dizziness for couple days.  Some vomiting  The history is provided by the patient. No language interpreter was used.  Weakness Severity:  Moderate Onset quality:  Sudden Timing:  Constant Progression:  Worsening Chronicity:  New Context: alcohol use   Relieved by:  Nothing Ineffective treatments:  None tried Associated symptoms: dizziness   Associated symptoms: no abdominal pain, no chest pain, no cough, no diarrhea, no frequency, no headaches and no seizures   Dizziness Associated symptoms: weakness   Associated symptoms: no chest pain, no diarrhea and no headaches     Past Medical History:  Diagnosis Date  . High cholesterol   . Hypertension   . PONV (postoperative nausea and vomiting)     Patient Active Problem List   Diagnosis Date Noted  . Acute hyponatremia 11/27/2018  . Essential hypertension 01/22/2015  . High cholesterol 01/22/2015    Past Surgical History:  Procedure Laterality Date  . BACK SURGERY     2014. Has had four back surgeryes total  . COLONOSCOPY N/A 02/26/2015   Procedure: COLONOSCOPY;  Surgeon: Rogene Houston, MD;  Location: [facility];  Service: Endoscopy;  Laterality: N/A;  1:00  . KNEE SURGERY     Left arthro        Home Medications    Prior to Admission medications   Medication Sig Start Date End Date Taking? Authorizing Provider  acetaminophen (TYLENOL) 325 MG tablet Take 650 mg by mouth every 6 (six) hours as needed.   Yes [provider]  ALPRAZolam Duanne Moron) 0.5 MG tablet Take 0.5 mg by mouth 3 (three) times daily as needed for anxiety.  01/08/15  Yes [provider]  amLODipine (NORVASC) 10 MG tablet Take 10 mg by  mouth daily.   Yes [provider]  atenolol-chlorthalidone (TENORETIC) 100-25 MG tablet Take 1 tablet by mouth daily.   Yes [provider]  benzonatate (TESSALON) 100 MG capsule Take 100 mg by mouth 4 (four) times daily as needed. 11/21/18  Yes [provider]  cholecalciferol (VITAMIN D) 400 units TABS tablet Take 1,000 Units by mouth.   Yes [provider]  cloNIDine (CATAPRES) 0.2 MG tablet Take 0.2 mg by mouth 2 (two) times daily.   Yes [provider]  HYDROcodone-acetaminophen (NORCO) 10-325 MG tablet Take 1-2 tablets by mouth 4 (four) times daily as needed. 11/21/18  Yes [provider]  Linaclotide (LINZESS) 145 MCG CAPS capsule Take 145 mcg by mouth daily.   Yes [provider]  metFORMIN (GLUCOPHAGE) 850 MG tablet Take 850 mg by mouth 2 (two) times daily with a meal.   Yes [provider]  methylPREDNISolone (MEDROL DOSEPAK) 4 MG TBPK tablet Take 1 tablet by mouth daily. 6,5,4,3,2,1 11/21/18  Yes [provider]  omeprazole (PRILOSEC) 20 MG capsule Take 20 mg by mouth daily.   Yes [provider]  potassium chloride SA (K-DUR,KLOR-CON) 20 MEQ tablet TK 1 T PO QD 01/23/15  Yes [provider]  rosuvastatin (CRESTOR) 20 MG tablet Take 20 mg by mouth daily.   Yes [provider]  temazepam (RESTORIL) 30 MG capsule Take 30 mg  by mouth at bedtime.  12/20/14  Yes [provider]  valACYclovir (VALTREX) 1000 MG tablet Take 1,000 mg by mouth 3 (three) times daily. 11/21/18  Yes [provider]  vitamin B-12 (CYANOCOBALAMIN) 1000 MCG tablet Take 1,000 mcg by mouth daily.   Yes [provider]  Wheat Dextrin (BENEFIBER DRINK MIX) PACK Take 4 g by mouth at bedtime. Patient not taking: Reported on 11/27/2018 02/26/15   Rogene Houston, MD    Family History Family History  Problem Relation Age of Onset  . Ulcerative colitis Paternal Grandmother     Social History  Social History   Tobacco Use  . Smoking status: Former Research scientist (life sciences)  . Tobacco comment: quit in 1990  Substance Use Topics  . Alcohol use: No    Alcohol/week: 0.0 standard drinks  . Drug use: No     Allergies   Allegra [fexofenadine] and Keflex [cephalexin]   Review of Systems Review of Systems  Constitutional: Negative for appetite change and fatigue.  HENT: Negative for congestion, ear discharge and sinus pressure.   Eyes: Negative for discharge.  Respiratory: Negative for cough.   Cardiovascular: Negative for chest pain.  Gastrointestinal: Negative for abdominal pain and diarrhea.  Genitourinary: Negative for frequency and hematuria.  Musculoskeletal: Negative for back pain.  Skin: Negative for rash.  Neurological: Positive for dizziness and weakness. Negative for seizures and headaches.  Psychiatric/Behavioral: Negative for hallucinations.     Physical Exam Updated Vital Signs BP (!) 168/112   Pulse 89   Temp 98.7 F (37.1 C) (Oral)   Resp (!) 21   Ht 5\' 8"  (1.727 m)   Wt 85.3 kg   SpO2 100%   BMI 28.59 kg/m   Physical Exam Vitals signs and nursing note reviewed.  Constitutional:      Appearance: He is well-developed.  HENT:     Head: Normocephalic.     Nose: Nose normal.  Eyes:     General: No scleral icterus.    Conjunctiva/sclera: Conjunctivae normal.  Neck:     Musculoskeletal: Neck supple.     Thyroid: No thyromegaly.  Cardiovascular:     Rate and Rhythm: Normal rate and regular rhythm.     Heart sounds: No murmur. No friction rub. No gallop.   Pulmonary:     Breath sounds: No stridor. No wheezing or rales.  Chest:     Chest wall: No tenderness.  Abdominal:     General: There is no distension.     Tenderness: There is no abdominal tenderness. There is no rebound.  Musculoskeletal: Normal range of motion.  Lymphadenopathy:     Cervical: No cervical adenopathy.  Skin:    Findings: No erythema or rash.  Neurological:     Mental Status: He is  oriented to person, place, and time.     Motor: No abnormal muscle tone.     Coordination: Coordination normal.  Psychiatric:        Behavior: Behavior normal.      ED Treatments / Results  Labs (all labs ordered are listed, but only abnormal results are displayed) Labs Reviewed  COMPREHENSIVE METABOLIC PANEL - Abnormal; Notable for the following components:      Result Value   Sodium 107 (*)    Potassium 2.7 (*)    Chloride 66 (*)    Glucose, Bld 268 (*)    AST 146 (*)    ALT 53 (*)    Total Bilirubin 1.8 (*)    Anion gap 16 (*)  All other components within normal limits  CBC - Abnormal; Notable for the following components:   WBC 17.0 (*)    HCT 37.3 (*)    MCHC 37.3 (*)    RDW 11.3 (*)    All other components within normal limits  CBG MONITORING, ED - Abnormal; Notable for the following components:   Glucose-Capillary 262 (*)    All other components within normal limits  SARS CORONAVIRUS 2 (TAT 6-24 HRS)  LIPASE, BLOOD  URINALYSIS, ROUTINE W REFLEX MICROSCOPIC  OSMOLALITY  OSMOLALITY, URINE  TSH  STREP PNEUMONIAE URINARY ANTIGEN  LEGIONELLA PNEUMOPHILA SEROGP 1 UR AG  BASIC METABOLIC PANEL  MAGNESIUM    EKG None  Radiology Dg Chest Portable 1 View  Result Date: 11/27/2018 CLINICAL DATA:  Weakness with vomiting and dizziness EXAM: PORTABLE CHEST 1 VIEW COMPARISON:  None. FINDINGS: Lordotic projection. Negative for heart failure. Cervical ACDF fusion plate. Possible left lower lobe atelectasis/infiltrate. This is not well evaluated on the current projection. IMPRESSION: Possible left lower lobe atelectasis/infiltrate. If the patient has acute chest symptoms, follow-up chest two-view recommended. Electronically Signed   By: Franchot Gallo M.D.   On: 11/27/2018 13:22    Procedures Procedures (including critical care time)  Medications Ordered in ED Medications  potassium chloride 10 mEq in 100 mL IVPB (10 mEq Intravenous New Bag/Given 11/27/18 1348)  sodium  chloride 0.9 % bolus 1,000 mL (1,000 mLs Intravenous New Bag/Given 11/27/18 1257)  potassium chloride 10 mEq in 100 mL IVPB ( Intravenous Rate/Dose Verify 11/27/18 1343)  ondansetron (ZOFRAN) injection 4 mg (4 mg Intravenous Given 11/27/18 1257)     Initial Impression / Assessment and Plan / ED Course  I have reviewed the triage vital signs and the nursing notes.  Pertinent labs & imaging results that were available during my care of the patient were reviewed by me and considered in my medical decision making (see chart for details).    CRITICAL CARE Performed by: Milton Ferguson Total critical care time:45 minutes Critical care time was exclusive of separately billable procedures and treating other patients. Critical care was necessary to treat or prevent imminent or life-threatening deterioration. Critical care was time spent personally by me on the following activities: development of treatment plan with patient and/or surrogate as well as nursing, discussions with consultants, evaluation of patient's response to treatment, examination of patient, obtaining history from patient or surrogate, ordering and performing treatments and interventions, ordering and review of laboratory studies, ordering and review of radiographic studies, pulse oximetry and re-evaluation of patient's condition.      Patient with hyponatremia and hypokalemia.  He will be admitted to medicine Final Clinical Impressions(s) / ED Diagnoses   Final diagnoses:  Hyponatremia    ED Discharge Orders    None       Milton Ferguson, MD 11/27/18 [phone]

## 2018-11-28 ENCOUNTER — Inpatient Hospital Stay: Payer: Self-pay

## 2018-11-28 ENCOUNTER — Encounter (HOSPITAL_COMMUNITY): Payer: Self-pay

## 2018-11-28 LAB — BASIC METABOLIC PANEL
Anion gap: 7 (ref 5–15)
Anion gap: 10 (ref 5–15)
BUN: 13 mg/dL (ref 8–23)
BUN: 13 mg/dL (ref 8–23)
CO2: 25 mmol/L (ref 22–32)
CO2: 24 mmol/L (ref 22–32)
Calcium: 8.7 mg/dL — ABNORMAL LOW (ref 8.9–10.3)
Calcium: 8.9 mg/dL (ref 8.9–10.3)
Chloride: 89 mmol/L — ABNORMAL LOW (ref 98–111)
Chloride: 92 mmol/L — ABNORMAL LOW (ref 98–111)
Creatinine, Ser: 0.97 mg/dL (ref 0.61–1.24)
Creatinine, Ser: 0.9 mg/dL (ref 0.61–1.24)
GFR calc Af Amer: >60 mL/min (ref >60)
GFR calc Af Amer: >60 mL/min (ref >60)
GFR calc non Af Amer: >60 mL/min (ref >60)
GFR calc non Af Amer: >60 mL/min (ref >60)
Glucose, Bld: 114 mg/dL — ABNORMAL HIGH (ref 70–99)
Glucose, Bld: 146 mg/dL — ABNORMAL HIGH (ref 70–99)
Potassium: 3.7 mmol/L (ref 3.5–5.1)
Potassium: 3.5 mmol/L (ref 3.5–5.1)
Sodium: 121 mmol/L — ABNORMAL LOW (ref 135–145)
Sodium: 126 mmol/L — ABNORMAL LOW (ref 135–145)

## 2018-11-28 LAB — LEGIONELLA PNEUMOPHILA SEROGP 1 UR AG: L. pneumophila Serogp 1 Ur Ag: NEGATIVE

## 2018-11-28 LAB — CBC
HCT: 36.8 % — ABNORMAL LOW (ref 39.0–52.0)
Hemoglobin: 13.2 g/dL (ref 13.0–17.0)
MCH: 31.6 pg (ref 26.0–34.0)
MCHC: 35.9 g/dL (ref 30.0–36.0)
MCV: 88 fL (ref 80.0–100.0)
Platelets: 217 10*3/uL (ref 150–400)
RBC: 4.18 MIL/uL — ABNORMAL LOW (ref 4.22–5.81)
RDW: 11.6 % (ref 11.5–15.5)
WBC: 13.3 10*3/uL — ABNORMAL HIGH (ref 4.0–10.5)
nRBC: 0 % (ref 0.0–0.2)

## 2018-11-28 LAB — GLUCOSE, CAPILLARY
Glucose-Capillary: 130 mg/dL — ABNORMAL HIGH (ref 70–99)
Glucose-Capillary: 127 mg/dL — ABNORMAL HIGH (ref 70–99)
Glucose-Capillary: 103 mg/dL — ABNORMAL HIGH (ref 70–99)
Glucose-Capillary: 174 mg/dL — ABNORMAL HIGH (ref 70–99)

## 2018-11-28 LAB — MRSA PCR SCREENING: MRSA by PCR: NEGATIVE

## 2018-11-28 LAB — SODIUM
Sodium: 122 mmol/L — ABNORMAL LOW (ref 135–145)
Sodium: 122 mmol/L — ABNORMAL LOW (ref 135–145)
Sodium: 120 mmol/L — ABNORMAL LOW (ref 135–145)
Sodium: 120 mmol/L — ABNORMAL LOW (ref 135–145)

## 2018-11-28 LAB — MAGNESIUM: Magnesium: 1.9 mg/dL (ref 1.7–2.4)

## 2018-11-28 LAB — SARS CORONAVIRUS 2 (TAT 6-24 HRS): SARS Coronavirus 2: NEGATIVE

## 2018-11-28 MED ORDER — SODIUM CHLORIDE 0.9% FLUSH: 10.0000 mL | INTRAVENOUS | Status: DC | PRN

## 2018-11-28 MED ORDER — DESMOPRESSIN ACETATE 4 MCG/ML IJ SOLN
4.0000 ug | Freq: Once | INTRAMUSCULAR | Status: AC
  Administered 2018-11-28: 4 ug via SUBCUTANEOUS
  Filled 2018-11-28: qty 1

## 2018-11-28 MED ORDER — SODIUM CHLORIDE 0.9% FLUSH
10.0000 mL | Freq: Two times a day (BID) | INTRAVENOUS | Status: DC
  Administered 2018-11-28 – 2018-11-30 (×4): 10 mL
  Administered 2018-11-30: 20 mL

## 2018-11-28 MED ORDER — CHLORHEXIDINE GLUCONATE CLOTH 2 % EX PADS
6.0000 | MEDICATED_PAD | Freq: Every day | CUTANEOUS | Status: DC
  Administered 2018-11-28 – 2018-12-01 (×4): 6 via TOPICAL

## 2018-11-28 MED ORDER — DESMOPRESSIN ACETATE 4 MCG/ML IJ SOLN
4.0000 ug | Freq: Two times a day (BID) | INTRAMUSCULAR | Status: DC
  Administered 2018-11-28 (×2): 4 ug via SUBCUTANEOUS
  Filled 2018-11-28 (×7): qty 1

## 2018-11-28 MED ORDER — DEXTROSE 5 % IV SOLN
INTRAVENOUS | Status: DC
  Administered 2018-11-28 (×2): via INTRAVENOUS

## 2018-11-28 NOTE — ED Notes (Signed)
ED TO INPATIENT HANDOFF REPORT  ED Nurse Name and Phone #: [phone]  S Name/Age/Gender Joseph Wolf 73 y.o. male Room/Bed: APA10/APA10  Code Status   Code Status: Full Code  Home/SNF/Other Home Patient oriented to: situation Is this baseline? Yes   Triage Complete: Triage complete  Chief Complaint emesis has shingles  Triage Note Pt states that last night he started vomiting and being weak. He states that he is not to steady on his feet. He is dizzy.    Allergies Allergies  Allergen Reactions  . Allegra [Fexofenadine]   . Keflex [Cephalexin]     Level of Care/Admitting Diagnosis ED Disposition    ED Disposition Condition [facility] Area: [facility] [id number]  Level of Care: Stepdown [14]  Covid Evaluation: N/A  Diagnosis: Acute hyponatremia [id number]  Admitting Physician: Rodena Goldmann [id number]  Attending Physician: Rodena Goldmann [id number]  Estimated length of stay: past midnight tomorrow  Certification:: I certify this patient will need inpatient services for at least 2 midnights  PT Class (Do Not Modify): Inpatient [101]  PT Acc Code (Do Not Modify): Private [1]       B Medical/Surgery History Past Medical History:  Diagnosis Date  . High cholesterol   . Hypertension   . PONV (postoperative nausea and vomiting)    Past Surgical History:  Procedure Laterality Date  . BACK SURGERY     2014. Has had four back surgeryes total  . COLONOSCOPY N/A 02/26/2015   Procedure: COLONOSCOPY;  Surgeon: Rogene Houston, MD;  Location: [facility];  Service: Endoscopy;  Laterality: N/A;  1:00  . KNEE SURGERY     Left arthro     A IV Location/Drains/Wounds Patient Lines/Drains/Airways Status   Active Line/Drains/Airways    Name:   Placement date:   Placement time:   Site:   Days:   Peripheral IV 11/27/18 Left Antecubital   11/27/18    1249    Antecubital   1   Peripheral IV 11/27/18 Right Antecubital   11/27/18    1715     Antecubital   1          Intake/Output Last 24 hours  Intake/Output Summary (Last 24 hours) at 11/28/2018 0308 Last data filed at 11/27/2018 1921 Gross per 24 hour  Intake 2027.07 ml  Output 1075 ml  Net 952.07 ml    Labs/Imaging Results for orders placed or performed during the hospital encounter of 11/27/18 (from the past 48 hour(s))  Urinalysis, Routine w reflex microscopic     Status: Abnormal   Collection Time: 11/27/18 10:38 AM  Result Value Ref Range   Color, Urine STRAW (A) YELLOW   APPearance CLEAR CLEAR   Specific Gravity, Urine 1.006 1.005 - 1.030   pH 7.0 5.0 - 8.0   Glucose, UA 150 (A) NEGATIVE mg/dL   Hgb urine dipstick LARGE (A) NEGATIVE   Bilirubin Urine NEGATIVE NEGATIVE   Ketones, ur NEGATIVE NEGATIVE mg/dL   Protein, ur 100 (A) NEGATIVE mg/dL   Nitrite NEGATIVE NEGATIVE   Leukocytes,Ua NEGATIVE NEGATIVE   RBC / HPF 0-5 0 - 5 RBC/hpf   WBC, UA 0-5 0 - 5 WBC/hpf   Bacteria, UA NONE SEEN NONE SEEN    Comment: Performed at [facility], [address] [place], [place] [address]  Lipase, blood     Status: None   Collection Time: 11/27/18 11:16 AM  Result Value Ref Range   Lipase 19 11 - 51  U/L    Comment: Performed at [facility], [address] [place], [place] [address]  Comprehensive metabolic panel     Status: Abnormal   Collection Time: 11/27/18 11:16 AM  Result Value Ref Range   Sodium 107 (LL) 135 - 145 mmol/L    Comment: CRITICAL RESULT CALLED TO, READ BACK BY AND VERIFIED WITH: HEATHER C.,RN  @1230  11/09/2020KAY    Potassium 2.7 (LL) 3.5 - 5.1 mmol/L    Comment: CRITICAL RESULT CALLED TO, READ BACK BY AND VERIFIED WITH: HEATHER C.,RN @1231  11/09/2020KAY    Chloride 66 (L) 98 - 111 mmol/L   CO2 25 22 - 32 mmol/L   Glucose, Bld 268 (H) 70 - 99 mg/dL   BUN 15 8 - 23 mg/dL   Creatinine, Ser 0.85 0.61 - 1.24 mg/dL   Calcium 8.9 8.9 - 10.3 mg/dL   Total Protein 7.3 6.5 - 8.1 g/dL   Albumin 4.1 3.5 - 5.0 g/dL   AST 146 (H) 15 - 41 U/L   ALT  53 (H) 0 - 44 U/L   Alkaline Phosphatase 58 38 - 126 U/L   Total Bilirubin 1.8 (H) 0.3 - 1.2 mg/dL   GFR calc non Af Amer >60 >60 mL/min   GFR calc Af Amer >60 >60 mL/min   Anion gap 16 (H) 5 - 15    Comment: Performed at [facility], [address], [place], [place] [address]  CBC     Status: Abnormal   Collection Time: 11/27/18 11:16 AM  Result Value Ref Range   WBC 17.0 (H) 4.0 - 10.5 K/uL   RBC 4.50 4.22 - 5.81 MIL/uL   Hemoglobin 13.9 13.0 - 17.0 g/dL   HCT 37.3 (L) 39.0 - 52.0 %   MCV 82.9 80.0 - 100.0 fL   MCH 30.9 26.0 - 34.0 pg   MCHC 37.3 (H) 30.0 - 36.0 g/dL   RDW 11.3 (L) 11.5 - 15.5 %   Platelets 278 150 - 400 K/uL   nRBC 0.0 0.0 - 0.2 %    Comment: Performed at [facility], [address], [place], [place] [address]  SARS CORONAVIRUS 2 (TAT 6-24 HRS) Nasopharyngeal Nasopharyngeal Swab     Status: None   Collection Time: 11/27/18  1:05 PM   Specimen: Nasopharyngeal Swab  Result Value Ref Range   SARS Coronavirus 2 NEGATIVE NEGATIVE    Comment: (NOTE) SARS-CoV-2 target nucleic acids are NOT DETECTED. The SARS-CoV-2 RNA is generally detectable in upper and lower respiratory specimens during the acute phase of infection. Negative results do not preclude SARS-CoV-2 infection, do not rule out co-infections with other pathogens, and should not be used as the sole basis for treatment or other patient management decisions. Negative results must be combined with clinical observations, patient history, and epidemiological information. The expected result is Negative. Fact Sheet for Patients: SugarRoll.be Fact Sheet for Healthcare Providers: https://www.woods-mathews.com/ This test is not yet approved or cleared by the [place] FDA and  has been authorized for detection and/or diagnosis of SARS-CoV-2 by FDA under an Emergency Use Authorization (EUA). This EUA will remain  in effect (meaning this test can be used) for the  duration of the COVID-19 declaration under Section 56 4(b)(1) of the Act, 21 U.S.C. section 360bbb-3(b)(1), unless the authorization is terminated or revoked sooner. Performed at [facility], [place] [address], [place], [place] [address]   CBG monitoring, ED     Status: Abnormal   Collection Time: 11/27/18  1:27 PM  Result Value Ref Range   Glucose-Capillary 262 (H) 70 - 99 mg/dL  Osmolality, urine     Status: None   Collection Time: 11/27/18  2:12 PM  Result Value Ref Range   Osmolality, Ur 381 300 - 900 mOsm/kg    Comment: Performed at [place] [address] [place], [place] [address]  Strep pneumoniae urinary antigen     Status: None   Collection Time: 11/27/18  2:12 PM  Result Value Ref Range   Strep Pneumo Urinary Antigen NEGATIVE NEGATIVE    Comment:        Infection due to S. pneumoniae cannot be absolutely ruled out since the antigen present may be below the detection limit of the test. Performed at [facility], [address], [place], [place] [address]   Sodium, urine, random     Status: None   Collection Time: 11/27/18  2:15 PM  Result Value Ref Range   Sodium, Ur 13 mmol/L    Comment: Performed at [facility], [address] [place], [place] [address]  Osmolality     Status: Abnormal   Collection Time: 11/27/18  2:40 PM  Result Value Ref Range   Osmolality 235 (LL) 275 - 295 mOsm/kg    Comment: CRITICAL RESULT CALLED TO, READ BACK BY AND VERIFIED WITH: T TALBERT,RN 2005 11/27/2018 D BRADLEY Performed at [facility], [place] [address], [place], [place] [address]   TSH     Status: None   Collection Time: 11/27/18  2:43 PM  Result Value Ref Range   TSH 0.775 0.350 - 4.500 uIU/mL    Comment: Performed by a 3rd Generation assay with a functional sensitivity of <=0.01 uIU/mL. Performed at [facility], [address] [place], [place] [id number]   Basic metabolic panel     Status: Abnormal   Collection Time: 11/27/18  2:43 PM  Result  Value Ref Range   Sodium 107 (LL) 135 - 145 mmol/L    Comment: CRITICAL RESULT CALLED TO, READ BACK BY AND VERIFIED WITH: WHITE,M ON 11/27/18 AT 1520 BY LOY,C    Potassium 3.1 (L) 3.5 - 5.1 mmol/L   Chloride 68 (L) 98 - 111 mmol/L   CO2 22 22 - 32 mmol/L   Glucose, Bld 227 (H) 70 - 99 mg/dL   BUN 14 8 - 23 mg/dL   Creatinine, Ser 0.97 0.61 - 1.24 mg/dL   Calcium 8.5 (L) 8.9 - 10.3 mg/dL   GFR calc non Af Amer >60 >60 mL/min   GFR calc Af Amer >60 >60 mL/min   Anion gap 17 (H) 5 - 15    Comment: Performed at [facility], [address], [place], [place] [address]  Magnesium     Status: Abnormal   Collection Time: 11/27/18  2:43 PM  Result Value Ref Range   Magnesium 1.5 (L) 1.7 - 2.4 mg/dL    Comment: Performed at [facility], [address] [place], [place] [address]  Hemoglobin A1c     Status: Abnormal   Collection Time: 11/27/18  2:45 PM  Result Value Ref Range   Hgb A1c MFr Bld 6.8 (H) 4.8 - 5.6 %    Comment: (NOTE) Pre diabetes:          5.7%-6.4% Diabetes:              >6.4% Glycemic control for   <7.0% adults with diabetes    Mean Plasma Glucose 148.46 mg/dL    Comment: Performed  at [facility], [place] [address] [place], [place] [address]  Procalcitonin - Baseline     Status: None   Collection Time: 11/27/18  3:42 PM  Result Value Ref Range   Procalcitonin <0.10 ng/mL    Comment:        Interpretation: PCT (Procalcitonin) <= 0.5 ng/mL: Systemic infection (sepsis) is not likely. Local bacterial infection is possible. (NOTE)       Sepsis PCT Algorithm           Lower Respiratory Tract                                      Infection PCT Algorithm    ----------------------------     ----------------------------         PCT < 0.25 ng/mL                PCT < 0.10 ng/mL         Strongly encourage             Strongly discourage   discontinuation of antibiotics    initiation of antibiotics    ----------------------------     -----------------------------       PCT  0.25 - 0.50 ng/mL            PCT 0.10 - 0.25 ng/mL               OR       >80% decrease in PCT            Discourage initiation of                                            antibiotics      Encourage discontinuation           of antibiotics    ----------------------------     -----------------------------         PCT >= 0.50 ng/mL              PCT 0.26 - 0.50 ng/mL               AND        <80% decrease in PCT             Encourage initiation of                                             antibiotics       Encourage continuation           of antibiotics    ----------------------------     -----------------------------        PCT >= 0.50 ng/mL                  PCT > 0.50 ng/mL               AND         increase in PCT                  Strongly encourage  initiation of antibiotics    Strongly encourage escalation           of antibiotics                                     -----------------------------                                           PCT <= 0.25 ng/mL                                                 OR                                        > 80% decrease in PCT                                     Discontinue / Do not initiate                                             antibiotics Performed at [facility], [address] [place], [place] [address]   Culture, blood (routine x 2)     Status: None (Preliminary result)   Collection Time: 11/27/18  3:43 PM   Specimen: Right Antecubital; Blood  Result Value Ref Range   Specimen Description RIGHT ANTECUBITAL    Special Requests      BOTTLES DRAWN AEROBIC AND ANAEROBIC Blood Culture adequate volume Performed at [facility], [address], [place], [place] [address]    Culture PENDING    Report Status PENDING   Culture, blood (routine x 2)     Status: None (Preliminary result)   Collection Time: 11/27/18  3:44 PM   Specimen: BLOOD LEFT HAND  Result Value Ref Range   Specimen  Description BLOOD LEFT HAND    Special Requests      BOTTLES DRAWN AEROBIC ONLY Blood Culture results may not be optimal due to an inadequate volume of blood received in culture bottles Performed at [facility], [address], [place], [place] [address]    Culture PENDING    Report Status PENDING   Sodium     Status: Abnormal   Collection Time: 11/27/18  4:50 PM  Result Value Ref Range   Sodium 111 (LL) 135 - 145 mmol/L    Comment: CRITICAL RESULT CALLED TO, READ BACK BY AND VERIFIED WITH: DANIELS,B ON 11/27/18 AT [id number] BY LOY,C Performed at [facility], [address] [place], [place] [address]   Sodium     Status: Abnormal   Collection Time: 11/27/18  5:50 PM  Result Value Ref Range   Sodium 112 (LL) 135 - 145 mmol/L    Comment: CRITICAL RESULT CALLED TO, READ BACK BY AND VERIFIED WITH: DANIELS,B ON 11/27/18 AT 1955 BY LOY,C Performed at [facility], [address], [place], [place] [address]   Sodium  Status: Abnormal   Collection Time: 11/27/18  7:47 PM  Result Value Ref Range   Sodium 113 (LL) 135 - 145 mmol/L    Comment: CRITICAL RESULT CALLED TO, READ BACK BY AND VERIFIED WITH: MYRICK,B ON 11/27/18 AT 2045 BY LOY,C Performed at [facility], [address] [place], [place] [address]   Sodium     Status: Abnormal   Collection Time: 11/27/18  9:28 PM  Result Value Ref Range   Sodium 116 (LL) 135 - 145 mmol/L    Comment: CRITICAL RESULT CALLED TO, READ BACK BY AND VERIFIED WITH: MYRICK,B AT 2207 ON 11.9.20 BY ISLEY,B Performed at [facility], [address] [place], [place] [address]   CBG monitoring, ED     Status: Abnormal   Collection Time: 11/27/18 10:43 PM  Result Value Ref Range   Glucose-Capillary 214 (H) 70 - 99 mg/dL  Sodium     Status: Abnormal   Collection Time: 11/27/18 10:53 PM  Result Value Ref Range   Sodium 116 (LL) 135 - 145 mmol/L    Comment: CRITICAL RESULT CALLED TO, READ BACK BY AND VERIFIED WITH: B MYRICK,RN @2324  11/27/18 [facility] Performed  at [facility], [address], [place], [place] [address]    Dg Chest Portable 1 View  Result Date: 11/27/2018 CLINICAL DATA:  Weakness with vomiting and dizziness EXAM: PORTABLE CHEST 1 VIEW COMPARISON:  None. FINDINGS: Lordotic projection. Negative for heart failure. Cervical ACDF fusion plate. Possible left lower lobe atelectasis/infiltrate. This is not well evaluated on the current projection. IMPRESSION: Possible left lower lobe atelectasis/infiltrate. If the patient has acute chest symptoms, follow-up chest two-view recommended. Electronically Signed   By: Franchot Gallo M.D.   On: 11/27/2018 13:22   [place] Ekg Site Rite  Result Date: 11/27/2018 If Site Rite image not attached, placement could not be confirmed due to current cardiac rhythm.   Pending Labs Unresulted Labs (From admission, onward)    Start     Ordered   12/04/18 0500  Creatinine, serum  (enoxaparin (LOVENOX)    CrCl >/= 30 ml/min)  Weekly,   R    Comments: while on enoxaparin therapy    11/27/18 1448   11/29/18 0100  Sodium  Now then every 2 hours,   R     11/28/18 0000   11/28/18 0500  Magnesium  Tomorrow morning,   R     11/27/18 1448   11/28/18 [id number]  Basic metabolic panel  Tomorrow morning,   R     11/27/18 1448   11/28/18 0500  CBC  Tomorrow morning,   R     11/27/18 1448   11/27/18 1351  Legionella Pneumophila Serogp 1 Ur Ag  Once,   STAT     11/27/18 1350          Vitals/Pain Today's Vitals   11/27/18 2241 11/28/18 0100 11/28/18 0145 11/28/18 0200  BP: (!) 145/89 (!) 105/56 104/75 (!) 88/60  Pulse: 88 64 66 64  Resp:    15  Temp:      TempSrc:      SpO2:  93% 94% 97%  Weight:      Height:      PainSc:        Isolation Precautions No active isolations  Medications Medications  sodium chloride (hypertonic) 3 % solution ( Intravenous Rate/Dose Verify 11/27/18 1920)  HYDROcodone-acetaminophen (NORCO) 10-325 MG per tablet 1-2 tablet (has no administration in time range)  valACYclovir (VALTREX)  tablet 1,000 mg (1,000 mg  Oral Given 11/27/18 2242)  amLODipine (NORVASC) tablet 10 mg (10 mg Oral Not Given 11/27/18 1633)  cloNIDine (CATAPRES) tablet 0.2 mg (0.2 mg Oral Not Given 11/27/18 2245)  rosuvastatin (CRESTOR) tablet 20 mg (20 mg Oral Not Given 11/27/18 2026)  atenolol (TENORMIN) tablet 100 mg (100 mg Oral Given 11/27/18 2241)  ALPRAZolam (XANAX) tablet 0.5 mg (has no administration in time range)  temazepam (RESTORIL) capsule 30 mg (30 mg Oral Given 11/27/18 2242)  linaclotide (LINZESS) capsule 145 mcg (145 mcg Oral Not Given 11/27/18 2026)  pantoprazole (PROTONIX) EC tablet 40 mg (40 mg Oral Given 11/27/18 2242)  vitamin B-12 (CYANOCOBALAMIN) tablet 1,000 mcg (1,000 mcg Oral Not Given 11/27/18 2027)  cholecalciferol (VITAMIN D) tablet 1,000 Units (1,000 Units Oral Not Given 11/27/18 2026)  benzonatate (TESSALON) capsule 100 mg (has no administration in time range)  cefTRIAXone (ROCEPHIN) 1 g in sodium chloride 0.9 % 100 mL IVPB ( Intravenous Stopped 11/27/18 1639)  azithromycin (ZITHROMAX) 500 mg in sodium chloride 0.9 % 250 mL IVPB ( Intravenous Stopped 11/27/18 1818)  enoxaparin (LOVENOX) injection 40 mg (40 mg Subcutaneous Given 11/27/18 1637)  acetaminophen (TYLENOL) tablet 650 mg (has no administration in time range)    Or  acetaminophen (TYLENOL) suppository 650 mg (has no administration in time range)  ondansetron (ZOFRAN) tablet 4 mg (has no administration in time range)    Or  ondansetron (ZOFRAN) injection 4 mg (has no administration in time range)  insulin aspart (novoLOG) injection 0-9 Units (0 Units Subcutaneous Not Given 11/27/18 1920)  insulin aspart (novoLOG) injection 0-5 Units (2 Units Subcutaneous Given 11/27/18 2250)  0.9 %  sodium chloride infusion (500 mLs Intravenous New Bag/Given 11/27/18 1608)  sodium chloride 0.9 % bolus 1,000 mL (0 mLs Intravenous Stopped 11/27/18 1757)  potassium chloride 10 mEq in 100 mL IVPB (0 mEq Intravenous Stopped 11/27/18 1504)  potassium  chloride 10 mEq in 100 mL IVPB (0 mEq Intravenous Stopped 11/27/18 1921)  ondansetron (ZOFRAN) injection 4 mg (4 mg Intravenous Given 11/27/18 1257)  potassium chloride SA (KLOR-CON) CR tablet 40 mEq (40 mEq Oral Given 11/27/18 1446)  potassium chloride 10 mEq in 100 mL IVPB (0 mEq Intravenous Stopped 11/27/18 1955)    Mobility walks     Focused Assessments    R Recommendations: See Admitting Provider Note  Report given to:   Additional Notes:

## 2018-11-28 NOTE — Progress Notes (Signed)
PROGRESS NOTE    Joseph Wolf  [id number] DOB: 13-Sep-1945 DOA: 11/27/2018 PCP: Redmond School, MD   Brief Narrative:  Per HPI: Joseph Wolf is a 73 y.o. male with medical history significant for hypertension, dyslipidemia, and recent diagnosis of shingles who presented to the emergency department with weakness and dizziness over the last couple days as well as an episode of vomiting earlier today.  He was recently noted to have a rash over his scalp as well as some blistering on his lower lip and was diagnosed with shingles and started on Valtrex as well as steroid pack and given some narcotic pain medications.  He is good about taking his home medications which includes antihypertensives as well as a diuretic, chlorthalidone.  Since he has not been feeling too well he has been drinking some increased amount of fluids in the last 2 days.  This includes about 3 bottles of water as well as some [employer] and other beverages.  He continued to feel weak for which he presented to the ED.  He denies any fevers, chills, shortness of breath, chest pain or any other symptoms.  11/10: Patient seen and evaluated this morning.  He denies any further nausea or vomiting and would like his diet advanced if possible.  He is overall feeling less weak and is noted to be much better overall this morning.  His sodium levels have come up considerably overnight to 121 this morning.  Discussed case with nephrology this morning Dr.Coladonato who recommends DDAVP administration and this has been ordered.  We will plan to advance diet.  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 -We will need close monitoring in ICU  as patient is high risk for seizures and osmotic demyelination -3% hypertonic saline at 30 cc/h has been discontinued on the morning of 11/10 after potential overcorrection noted overnight -Continue to follow serum sodiums every 2 hours -Administer DDAVP 4 mcg subcutaneously x1 per nephrology recommendation and avoid hypotonic IV fluid for now -Fluid restriction  Hypokalemia-resolved -Monitor on telemetry - magnesium within normal limits -Reevaluate electrolytes in a.m.  Possible community-acquired pneumonia -Patient is noted to have some mild productive cough with decreasing leukocytosis noted on CBC -Procalcitonin is low -Maintain on Rocephin and azithromycin empirically -Follow CBC -Blood cultures x2 with no growth noted thus far -Urine strep pneumonia negative and Legionella pending  GERD -PPI  Recent shingles diagnosis -Continue valacyclovir as well as pain medications -Continue in negative pressure room/isolation  Hyperglycemia in setting of type 2 diabetes -Likely related to recent steroid taper -Hold home Metformin for now -Maintain on SSI coverage -A1c at 6.8%  Hypertension -Currently with elevated levels -Plan to continue home medications with the exception of chlorthalidone  Dyslipidemia -Continue statin   DVT prophylaxis: Lovenox Code Status: Full Family Communication: Discussed with wife at bedside on 11/9 Disposition Plan: Continue careful sodium correction.  Appreciate further evaluation and management from nephrology.  Monitor in ICU carefully with frequent sodium levels.  Advance diet today.   Consultants:   Nephrology  Procedures:   None  Antimicrobials:  Anti-infectives (From admission, onward)   Start     Dose/Rate Route Frequency Ordered Stop   11/27/18 1600  valACYclovir (VALTREX) tablet  1,000 mg     1,000 mg Oral 3 times daily 11/27/18 1448     11/27/18 1500  cefTRIAXone (ROCEPHIN) 1 g in sodium chloride 0.9 % 100 mL IVPB     1  g 200 mL/hr over 30 Minutes Intravenous Every 24 hours 11/27/18 1448     11/27/18 1500  azithromycin (ZITHROMAX) 500 mg in sodium chloride 0.9 % 250 mL IVPB     500 mg 250 mL/hr over 60 Minutes Intravenous Every 24 hours 11/27/18 1448         Subjective: Patient seen and evaluated today with no new acute complaints or concerns.  He states that he is feeling less weak and has no further nausea vomiting and would like his diet advanced.  He is coughing some and having some minimal clearish yellow sputum output.  Objective: Vitals:   11/28/18 0605 11/28/18 0606 11/28/18 0607 11/28/18 0639  BP: (!) 117/101  (!) 145/99 (!) 152/77  Pulse:  75 74 74  Resp:  19 (!) 22 17  Temp:  98.7 F (37.1 C)    TempSrc:  Axillary    SpO2: 99% 99% 97% 96%  Weight:  83.1 kg    Height:  5\' 9"  (1.753 m)      Intake/Output Summary (Last 24 hours) at 11/28/2018 0724 Last data filed at 11/28/2018 [id number] Gross per 24 hour  Intake 2589.77 ml  Output 1575 ml  Net 1014.77 ml   Filed Weights   11/27/18 1019 11/28/18 0606  Weight: 85.3 kg 83.1 kg    Examination:  General exam: Appears calm and comfortable  Respiratory system: Clear to auscultation. Respiratory effort normal. Cardiovascular system: S1 & S2 heard, RRR. No JVD, murmurs, rubs, gallops or clicks. No pedal edema. Gastrointestinal system: Abdomen is nondistended, soft and nontender. No organomegaly or masses felt. Normal bowel sounds heard. Central nervous system: Alert and oriented. No focal neurological deficits. Extremities: Symmetric 5 x 5 power. Skin: No rashes, lesions or ulcers Psychiatry: Judgement and insight appear normal. Mood & affect appropriate.     Data Reviewed: I have personally reviewed following labs and imaging studies  CBC: Recent Labs  Lab 11/27/18 1116 11/28/18 0503  WBC 17.0* 13.3*  HGB 13.9 13.2  HCT 37.3* 36.8*  MCV 82.9 88.0  PLT 278 [id number]   Basic Metabolic Panel: Recent Labs  Lab 11/27/18 1116  11/27/18 1443  11/27/18 1750 11/27/18 1947 11/27/18 2128 11/27/18 2253 11/28/18 0503  NA 107* 107*   < > 112* 113* 116* 116* 121*  K 2.7* 3.1*  --   --   --   --   --  3.7  CL 66* 68*  --   --   --   --   --  89*  CO2 25 22  --   --   --   --   --  25  GLUCOSE 268* 227*  --   --   --   --   --  114*  BUN 15 14  --   --   --   --   --  13  CREATININE 0.85 0.97  --   --   --   --   --  0.97  CALCIUM 8.9 8.5*  --   --   --   --   --  8.7*  MG  --  1.5*  --   --   --   --   --  1.9   < > = values  in this interval not displayed.   GFR: Estimated Creatinine Clearance: 67.8 mL/min (by C-G formula based on SCr of 0.97 mg/dL). Liver Function Tests: Recent Labs  Lab 11/27/18 1116  AST 146*  ALT 53*  ALKPHOS 58  BILITOT 1.8*  PROT 7.3  ALBUMIN 4.1   Recent Labs  Lab 11/27/18 1116  LIPASE 19   No results for input(s): AMMONIA in the last 168 hours. Coagulation Profile: No results for input(s): INR, PROTIME in the last 168 hours. Cardiac Enzymes: No results for input(s): CKTOTAL, CKMB, CKMBINDEX, TROPONINI in the last 168 hours. BNP (last 3 results) No results for input(s): PROBNP in the last 8760 hours. HbA1C: Recent Labs    11/27/18 1445  HGBA1C 6.8*   CBG: Recent Labs  Lab 11/27/18 1327 11/27/18 2243  GLUCAP 262* 214*   Lipid Profile: No results for input(s): CHOL, HDL, LDLCALC, TRIG, CHOLHDL, LDLDIRECT in the last 72 hours. Thyroid Function Tests: Recent Labs    11/27/18 1443  TSH 0.775   Anemia Panel: No results for input(s): VITAMINB12, FOLATE, FERRITIN, TIBC, IRON, RETICCTPCT in the last 72 hours. Sepsis Labs: Recent Labs  Lab 11/27/18 1542  PROCALCITON <0.10    Recent Results (from the past 240 hour(s))  SARS CORONAVIRUS 2 (TAT 6-24 HRS) Nasopharyngeal Nasopharyngeal Swab     Status: None   Collection Time: 11/27/18  1:05 PM   Specimen: Nasopharyngeal Swab  Result Value Ref Range Status   SARS Coronavirus 2 NEGATIVE NEGATIVE Final    Comment:  (NOTE) SARS-CoV-2 target nucleic acids are NOT DETECTED. The SARS-CoV-2 RNA is generally detectable in upper and lower respiratory specimens during the acute phase of infection. Negative results do not preclude SARS-CoV-2 infection, do not rule out co-infections with other pathogens, and should not be used as the sole basis for treatment or other patient management decisions. Negative results must be combined with clinical observations, patient history, and epidemiological information. The expected result is Negative. Fact Sheet for Patients: SugarRoll.be Fact Sheet for Healthcare Providers: https://www.woods-mathews.com/ This test is not yet approved or cleared by the [place] FDA and  has been authorized for detection and/or diagnosis of SARS-CoV-2 by FDA under an Emergency Use Authorization (EUA). This EUA will remain  in effect (meaning this test can be used) for the duration of the COVID-19 declaration under Section 56 4(b)(1) of the Act, 21 U.S.C. section 360bbb-3(b)(1), unless the authorization is terminated or revoked sooner. Performed at [facility], [place] [address] [place], [place] [address]   Culture, blood (routine x 2)     Status: None (Preliminary result)   Collection Time: 11/27/18  3:43 PM   Specimen: Blood  Result Value Ref Range Status   Specimen Description RIGHT ANTECUBITAL  Final   Special Requests   Final    BOTTLES DRAWN AEROBIC AND ANAEROBIC Blood Culture adequate volume Performed at [facility], [address], [place], [place] [address]    Culture PENDING  Incomplete   Report Status PENDING  Incomplete  Culture, blood (routine x 2)     Status: None (Preliminary result)   Collection Time: 11/27/18  3:44 PM   Specimen: Blood  Result Value Ref Range Status   Specimen Description BLOOD LEFT HAND  Final   Special Requests   Final    BOTTLES DRAWN AEROBIC ONLY Blood Culture results may not be optimal due  to an inadequate volume of blood received in culture bottles Performed at [facility], [address] [place], [place] [address]  Culture PENDING  Incomplete   Report Status PENDING  Incomplete         Radiology Studies: Dg Chest Portable 1 View  Result Date: 11/27/2018 CLINICAL DATA:  Weakness with vomiting and dizziness EXAM: PORTABLE CHEST 1 VIEW COMPARISON:  None. FINDINGS: Lordotic projection. Negative for heart failure. Cervical ACDF fusion plate. Possible left lower lobe atelectasis/infiltrate. This is not well evaluated on the current projection. IMPRESSION: Possible left lower lobe atelectasis/infiltrate. If the patient has acute chest symptoms, follow-up chest two-view recommended. Electronically Signed   By: Franchot Gallo M.D.   On: 11/27/2018 13:22   [place] Ekg Site Rite  Result Date: 11/27/2018 If Site Rite image not attached, placement could not be confirmed due to current cardiac rhythm.       Scheduled Meds:  amLODipine  10 mg Oral Daily   atenolol  100 mg Oral Daily   Chlorhexidine Gluconate Cloth  6 each Topical Daily   cholecalciferol  1,000 Units Oral Daily   cloNIDine  0.2 mg Oral BID   desmopressin  4 mcg Subcutaneous Once   enoxaparin (LOVENOX) injection  40 mg Subcutaneous Q24H   insulin aspart  0-5 Units Subcutaneous QHS   insulin aspart  0-9 Units Subcutaneous TID WC   linaclotide  145 mcg Oral Daily   pantoprazole  40 mg Oral Daily   rosuvastatin  20 mg Oral q1800   temazepam  30 mg Oral QHS   valACYclovir  1,000 mg Oral TID   vitamin B-12  1,000 mcg Oral Daily   Continuous Infusions:  sodium chloride 500 mL (11/27/18 1608)   azithromycin Stopped (11/27/18 1818)   cefTRIAXone (ROCEPHIN)  IV Stopped (11/27/18 1639)     LOS: 1 day    Time spent: 30 minutes    Prabhav Faulkenberry Darleen Crocker, DO [facility] Pager [phone]  If 7PM-7AM, please contact night-coverage [email or web address] Password [email or web address] 11/28/2018, 7:24 AM

## 2018-11-28 NOTE — Progress Notes (Signed)
Peripherally Inserted Central Catheter/Midline Placement  The IV Nurse has discussed with the patient and/or persons authorized to consent for the patient, the purpose of this procedure and the potential benefits and risks involved with this procedure.  The benefits include less needle sticks, lab draws from the catheter, and the patient may be discharged home with the catheter. Risks include, but not limited to, infection, bleeding, blood clot (thrombus formation), and puncture of an artery; nerve damage and irregular heartbeat and possibility to perform a PICC exchange if needed/ordered by physician.  Alternatives to this procedure were also discussed.  Bard Power PICC patient education guide, fact sheet on infection prevention and patient information card has been provided to patient /or left at bedside.    PICC/Midline Placement Documentation  PICC Double Lumen 11/28/18 PICC Right Brachial 40 cm 0 cm (Active)  Indication for Insertion or Continuance of Line Administration of hyperosmolar/irritating solutions (i.e. TPN, Vancomycin, etc.) 11/28/18 1756  Exposed Catheter (cm) 0 cm 11/28/18 1756  Site Assessment Clean;Dry;Intact 11/28/18 1756  Lumen #1 Status Flushed;Blood return noted;Saline locked 11/28/18 1756  Lumen #2 Status Flushed;Blood return noted;Saline locked 11/28/18 1756  Dressing Type Transparent 11/28/18 1756  Dressing Status Clean;Dry;Intact;Antimicrobial disc in place 11/28/18 1756  Dressing Change Due 12/05/18 11/28/18 1756       Scotty Court 11/28/2018, 5:58 PM

## 2018-11-28 NOTE — Consult Note (Signed)
Reason for Consult: Hyponatremia Referring Physician:  Shawna Orleans is an 73 y.o. male.  HPI: Mr. Heinle has a PMH significant for HTN, and HLD, who was diagnosed with herpes zoster eruption on his right forehead/eye/ear and started on valtrex, prednisone, and narcotic pain medications.  He developed N/V and weakness over the past few days before presenting to [facility] ED.  IN the ED he was hemodynamically stable but labs were remarkable for a serum sodium of 107.  Dr. Manuella Ghazi consulted me over the phone and I recommended admission to the ICU and to initiate hypertonic saline infusion at 30 ml/hr.  His serum sodium trend is seen below.  Hypertonic saline was stopped this morning after his sodium level increased to 121.  He is feeling much better but there is concern that his rate of correction was too fast.  Unfortunately his hourly sodium levels stopped at 11 pm last night and was not checked again for 6 hours.  I recommended ddAVP 37mcg sq to make sure his sodium level does not continue to rise, however repeat labs still pending 6 hours later.     Trend in Creatinine: Sodium  Date/Time Value Ref Range Status  11/28/2018 05:03 AM 121 (L) 135 - 145 mmol/L Final  11/27/2018 10:53 PM 116 (LL) 135 - 145 mmol/L Final  11/27/2018 09:28 PM 116 (LL) 135 - 145 mmol/L Final  11/27/2018 07:47 PM 113 (LL) 135 - 145 mmol/L Final  11/27/2018 05:50 PM 112 (LL) 135 - 145 mmol/L Final  11/27/2018 04:50 PM 111 (LL) 135 - 145 mmol/L Final  11/27/2018 02:43 PM 107 (LL) 135 - 145 mmol/L Final  11/27/2018 11:16 AM 107 (LL) 135 - 145 mmol/L Final   Creatinine, Ser  Date/Time Value Ref Range Status  11/28/2018 05:03 AM 0.97 0.61 - 1.24 mg/dL Final  11/27/2018 02:43 PM 0.97 0.61 - 1.24 mg/dL Final  11/27/2018 11:16 AM 0.85 0.61 - 1.24 mg/dL Final   PMH:   Past Medical History:  Diagnosis Date  . High cholesterol   . Hypertension   . PONV (postoperative nausea and vomiting)     PSH:   Past Surgical History:   Procedure Laterality Date  . BACK SURGERY     2014. Has had four back surgeryes total  . COLONOSCOPY N/A 02/26/2015   Procedure: COLONOSCOPY;  Surgeon: Rogene Houston, MD;  Location: [facility];  Service: Endoscopy;  Laterality: N/A;  1:00  . KNEE SURGERY     Left arthro    Allergies:  Allergies  Allergen Reactions  . Allegra [Fexofenadine]   . Keflex [Cephalexin]     Medications:   Prior to Admission medications   Medication Sig Start Date End Date Taking? Authorizing Provider  acetaminophen (TYLENOL) 325 MG tablet Take 650 mg by mouth every 6 (six) hours as needed.   Yes [provider]  ALPRAZolam Duanne Moron) 0.5 MG tablet Take 0.5 mg by mouth 3 (three) times daily as needed for anxiety.  01/08/15  Yes [provider]  amLODipine (NORVASC) 10 MG tablet Take 10 mg by mouth daily.   Yes [provider]  atenolol-chlorthalidone (TENORETIC) 100-25 MG tablet Take 1 tablet by mouth daily.   Yes [provider]  benzonatate (TESSALON) 100 MG capsule Take 100 mg by mouth 4 (four) times daily as needed. 11/21/18  Yes [provider]  cholecalciferol (VITAMIN D) 400 units TABS tablet Take 1,000 Units by mouth.   Yes [provider]  cloNIDine (CATAPRES)  0.2 MG tablet Take 0.2 mg by mouth 2 (two) times daily.   Yes [provider]  HYDROcodone-acetaminophen (NORCO) 10-325 MG tablet Take 1-2 tablets by mouth 4 (four) times daily as needed. 11/21/18  Yes [provider]  Linaclotide (LINZESS) 145 MCG CAPS capsule Take 145 mcg by mouth daily.   Yes [provider]  metFORMIN (GLUCOPHAGE) 850 MG tablet Take 850 mg by mouth 2 (two) times daily with a meal.   Yes [provider]  methylPREDNISolone (MEDROL DOSEPAK) 4 MG TBPK tablet Take 1 tablet by mouth daily. 6,5,4,3,2,1 11/21/18  Yes [provider]  omeprazole (PRILOSEC) 20 MG capsule Take 20 mg by mouth daily.   Yes [provider]   potassium chloride SA (K-DUR,KLOR-CON) 20 MEQ tablet TK 1 T PO QD 01/23/15  Yes [provider]  rosuvastatin (CRESTOR) 20 MG tablet Take 20 mg by mouth daily.   Yes [provider]  temazepam (RESTORIL) 30 MG capsule Take 30 mg by mouth at bedtime.  12/20/14  Yes [provider]  valACYclovir (VALTREX) 1000 MG tablet Take 1,000 mg by mouth 3 (three) times daily. 11/21/18  Yes [provider]  vitamin B-12 (CYANOCOBALAMIN) 1000 MCG tablet Take 1,000 mcg by mouth daily.   Yes [provider]  Wheat Dextrin (BENEFIBER DRINK MIX) PACK Take 4 g by mouth at bedtime. Patient not taking: Reported on 11/27/2018 02/26/15   Rogene Houston, MD    Discontinued Meds:   Medications Discontinued During This Encounter  Medication Reason  . potassium chloride (K-DUR) 10 MEQ tablet Error  . sodium chloride (hypertonic) 3 % solution     Social History:  reports that he has quit smoking. His smoking use included cigarettes. He has never used smokeless tobacco. He reports that he does not drink alcohol or use drugs.  Family History:   Family History  Problem Relation Age of Onset  . Ulcerative colitis Paternal Grandmother     Pertinent items are noted in HPI.  Blood pressure 137/74, pulse 76, temperature 98.7 F (37.1 C), temperature source Axillary, resp. rate (!) 22, height 5\' 9"  (1.753 m), weight 83.1 kg, SpO2 97 %. General appearance: alert, cooperative, fatigued and no distress Head: vesicular rash along the right forehead involving right ear Eyes: erythema of right conjunctiva Resp: clear to auscultation bilaterally Cardio: regular rate and rhythm, S1, S2 normal, no murmur, click, rub or gallop GI: soft, non-tender; bowel sounds normal; no masses,  no organomegaly Extremities: extremities normal, atraumatic, no cyanosis or edema  Labs: Basic Metabolic Panel: Recent Labs  Lab 11/27/18 1116 11/27/18 1443 11/27/18 1650 11/27/18 1750 11/27/18 1947  11/27/18 2128 11/27/18 2253 11/28/18 0503 11/28/18 1028  NA 107* 107* 111* 112* 113* 116* 116* 121* 126*  K 2.7* 3.1*  --   --   --   --   --  3.7 3.5  CL 66* 68*  --   --   --   --   --  89* 92*  CO2 25 22  --   --   --   --   --  25 24  GLUCOSE 268* 227*  --   --   --   --   --  114* 146*  BUN 15 14  --   --   --   --   --  13 13  CREATININE 0.85 0.97  --   --   --   --   --  0.97 0.90  ALBUMIN 4.1  --   --   --   --   --   --   --   --   CALCIUM 8.9 8.5*  --   --   --   --   --  8.7* 8.9   Liver Function Tests: Recent Labs  Lab 11/27/18 1116  AST 146*  ALT 53*  ALKPHOS 58  BILITOT 1.8*  PROT 7.3  ALBUMIN 4.1   Recent Labs  Lab 11/27/18 1116  LIPASE 19   No results for input(s): AMMONIA in the last 168 hours. CBC: Recent Labs  Lab 11/27/18 1116 11/28/18 0503  WBC 17.0* 13.3*  HGB 13.9 13.2  HCT 37.3* 36.8*  MCV 82.9 88.0  PLT 278 217   PT/INR: @labrcntip (inr:5) Cardiac Enzymes: No results for input(s): CKTOTAL, CKMB, CKMBINDEX, TROPONINI in the last 168 hours. CBG: Recent Labs  Lab 11/27/18 1327 11/27/18 2243 11/28/18 0825  GLUCAP 262* 214* 130*    Iron Studies: No results for input(s): IRON, TIBC, TRANSFERRIN, FERRITIN in the last 168 hours.  Xrays/Other Studies: Dg Chest Portable 1 View  Result Date: 11/27/2018 CLINICAL DATA:  Weakness with vomiting and dizziness EXAM: PORTABLE CHEST 1 VIEW COMPARISON:  None. FINDINGS: Lordotic projection. Negative for heart failure. Cervical ACDF fusion plate. Possible left lower lobe atelectasis/infiltrate. This is not well evaluated on the current projection. IMPRESSION: Possible left lower lobe atelectasis/infiltrate. If the patient has acute chest symptoms, follow-up chest two-view recommended. Electronically Signed   By: Franchot Gallo M.D.   On: 11/27/2018 13:22   [place] Ekg Site Rite  Result Date: 11/28/2018 If Site Rite image not attached, placement could not be confirmed due to current cardiac rhythm.  [place]  Ekg Site Rite  Result Date: 11/27/2018 If Site Rite image not attached, placement could not be confirmed due to current cardiac rhythm.    Assessment/Plan: 1. Severe, symptomatic Hyponatremia-  Presumably acute/subacute over the past 2-3 days.  Given low Urine Na and h/o N/V this is most consistent with hypovolemic, hyponatremia, exacerbated by thiazide diuretics.  Given the severity of his hyponatremia, hypertonic saline was recommended with marked improvement.  Unfortunately his hourly labs did not continue throughout the evening and we were left with 6 hours between values.  His sodium has risen from 107 to 121 (14 mEq over 18 hours) which is slightly faster than preferred 8 mEq/24 hours.  He was given ddAVP but repeat sodium 5 hours later is 126.   1. Will start hypotonic saline and redose with ddAVP and try to keep sodium level around 121 for the next 12-24 hours to prevent ODS 2. Chlorthalidone has been held 3. Continue to follow sodium levels and recommend picc line due to frequent labs drawas 2. Herpes zoster- per primary svc. 3. Hypokalemia- repleted 4. Possible CAP- per prmiary svc.  On rocephin and azithromycin    Donetta Potts 11/28/2018, 10:43 AM

## 2018-11-29 DIAGNOSIS — E119 Type 2 diabetes mellitus without complications: Secondary | ICD-10-CM

## 2018-11-29 DIAGNOSIS — E1169 Type 2 diabetes mellitus with other specified complication: Secondary | ICD-10-CM

## 2018-11-29 DIAGNOSIS — E785 Hyperlipidemia, unspecified: Secondary | ICD-10-CM

## 2018-11-29 DIAGNOSIS — B029 Zoster without complications: Secondary | ICD-10-CM

## 2018-11-29 DIAGNOSIS — E876 Hypokalemia: Secondary | ICD-10-CM

## 2018-11-29 LAB — BASIC METABOLIC PANEL
Anion gap: 10 (ref 5–15)
Anion gap: 9 (ref 5–15)
Anion gap: 8 (ref 5–15)
Anion gap: 9 (ref 5–15)
BUN: 16 mg/dL (ref 8–23)
BUN: 15 mg/dL (ref 8–23)
BUN: 14 mg/dL (ref 8–23)
BUN: 13 mg/dL (ref 8–23)
CO2: 24 mmol/L (ref 22–32)
CO2: 25 mmol/L (ref 22–32)
CO2: 23 mmol/L (ref 22–32)
CO2: 23 mmol/L (ref 22–32)
Calcium: 8.5 mg/dL — ABNORMAL LOW (ref 8.9–10.3)
Calcium: 8.5 mg/dL — ABNORMAL LOW (ref 8.9–10.3)
Calcium: 7.8 mg/dL — ABNORMAL LOW (ref 8.9–10.3)
Calcium: 7.8 mg/dL — ABNORMAL LOW (ref 8.9–10.3)
Chloride: 87 mmol/L — ABNORMAL LOW (ref 98–111)
Chloride: 84 mmol/L — ABNORMAL LOW (ref 98–111)
Chloride: 86 mmol/L — ABNORMAL LOW (ref 98–111)
Chloride: 86 mmol/L — ABNORMAL LOW (ref 98–111)
Creatinine, Ser: 0.94 mg/dL (ref 0.61–1.24)
Creatinine, Ser: 0.9 mg/dL (ref 0.61–1.24)
Creatinine, Ser: 0.84 mg/dL (ref 0.61–1.24)
Creatinine, Ser: 0.92 mg/dL (ref 0.61–1.24)
GFR calc Af Amer: >60 mL/min (ref >60)
GFR calc Af Amer: >60 mL/min (ref >60)
GFR calc Af Amer: >60 mL/min (ref >60)
GFR calc Af Amer: >60 mL/min (ref >60)
GFR calc non Af Amer: >60 mL/min (ref >60)
GFR calc non Af Amer: >60 mL/min (ref >60)
GFR calc non Af Amer: >60 mL/min (ref >60)
GFR calc non Af Amer: >60 mL/min (ref >60)
Glucose, Bld: 162 mg/dL — ABNORMAL HIGH (ref 70–99)
Glucose, Bld: 181 mg/dL — ABNORMAL HIGH (ref 70–99)
Glucose, Bld: 130 mg/dL — ABNORMAL HIGH (ref 70–99)
Glucose, Bld: 159 mg/dL — ABNORMAL HIGH (ref 70–99)
Potassium: 2.9 mmol/L — ABNORMAL LOW (ref 3.5–5.1)
Potassium: 3.3 mmol/L — ABNORMAL LOW (ref 3.5–5.1)
Potassium: 3.3 mmol/L — ABNORMAL LOW (ref 3.5–5.1)
Potassium: 3.2 mmol/L — ABNORMAL LOW (ref 3.5–5.1)
Sodium: 121 mmol/L — ABNORMAL LOW (ref 135–145)
Sodium: 118 mmol/L — CL (ref 135–145)
Sodium: 117 mmol/L — CL (ref 135–145)
Sodium: 118 mmol/L — CL (ref 135–145)

## 2018-11-29 LAB — CBC
HCT: 33.6 % — ABNORMAL LOW (ref 39.0–52.0)
Hemoglobin: 11.9 g/dL — ABNORMAL LOW (ref 13.0–17.0)
MCH: 31.2 pg (ref 26.0–34.0)
MCHC: 35.4 g/dL (ref 30.0–36.0)
MCV: 88 fL (ref 80.0–100.0)
Platelets: 214 10*3/uL (ref 150–400)
RBC: 3.82 MIL/uL — ABNORMAL LOW (ref 4.22–5.81)
RDW: 11.9 % (ref 11.5–15.5)
WBC: 14.1 10*3/uL — ABNORMAL HIGH (ref 4.0–10.5)
nRBC: 0 % (ref 0.0–0.2)

## 2018-11-29 LAB — HEPATIC FUNCTION PANEL
ALT: 39 U/L (ref 0–44)
AST: 50 U/L — ABNORMAL HIGH (ref 15–41)
Albumin: 3.2 g/dL — ABNORMAL LOW (ref 3.5–5.0)
Alkaline Phosphatase: 47 U/L (ref 38–126)
Bilirubin, Direct: 0.1 mg/dL (ref 0.0–0.2)
Indirect Bilirubin: 1 mg/dL — ABNORMAL HIGH (ref 0.3–0.9)
Total Bilirubin: 1.1 mg/dL (ref 0.3–1.2)
Total Protein: 5.9 g/dL — ABNORMAL LOW (ref 6.5–8.1)

## 2018-11-29 LAB — SODIUM
Sodium: 119 mmol/L — CL (ref 135–145)
Sodium: 119 mmol/L — CL (ref 135–145)
Sodium: 121 mmol/L — ABNORMAL LOW (ref 135–145)
Sodium: 121 mmol/L — ABNORMAL LOW (ref 135–145)

## 2018-11-29 LAB — CREATININE, URINE, RANDOM: Creatinine, Urine: 74.67 mg/dL

## 2018-11-29 LAB — MAGNESIUM: Magnesium: 1.8 mg/dL (ref 1.7–2.4)

## 2018-11-29 LAB — GLUCOSE, CAPILLARY
Glucose-Capillary: 146 mg/dL — ABNORMAL HIGH (ref 70–99)
Glucose-Capillary: 181 mg/dL — ABNORMAL HIGH (ref 70–99)
Glucose-Capillary: 105 mg/dL — ABNORMAL HIGH (ref 70–99)
Glucose-Capillary: 166 mg/dL — ABNORMAL HIGH (ref 70–99)

## 2018-11-29 LAB — SODIUM, URINE, RANDOM: Sodium, Ur: 120 mmol/L

## 2018-11-29 LAB — OSMOLALITY, URINE: Osmolality, Ur: 508 mOsm/kg (ref 300–900)

## 2018-11-29 LAB — OSMOLALITY: Osmolality: 231 mOsm/kg — CL (ref 275–295)

## 2018-11-29 MED ORDER — SODIUM CHLORIDE 0.9 % IV SOLN
INTRAVENOUS | Status: DC
  Administered 2018-11-29 – 2018-12-01 (×5): via INTRAVENOUS

## 2018-11-29 MED ORDER — MAGNESIUM SULFATE 2 GM/50ML IV SOLN
2.0000 g | Freq: Once | INTRAVENOUS | Status: AC
  Administered 2018-11-29: 2 g via INTRAVENOUS

## 2018-11-29 MED ORDER — POTASSIUM CHLORIDE CRYS ER 20 MEQ PO TBCR
40.0000 meq | EXTENDED_RELEASE_TABLET | Freq: Once | ORAL | Status: AC
  Administered 2018-11-29: 40 meq via ORAL
  Filled 2018-11-29: qty 2

## 2018-11-29 MED ORDER — POTASSIUM CHLORIDE CRYS ER 20 MEQ PO TBCR
40.0000 meq | EXTENDED_RELEASE_TABLET | ORAL | Status: AC
  Administered 2018-11-29 (×2): 40 meq via ORAL
  Filled 2018-11-29 (×2): qty 2

## 2018-11-29 NOTE — Progress Notes (Addendum)
PROGRESS NOTE    Joseph Wolf  [id number] DOB: [birth year] DOA: 11/27/2018 PCP: Redmond School, MD    Brief Narrative:  73 year old male who presented with weakness and dizziness, he does have significant past medical history for hypertension and dyslipidemia.  Recently diagnosed with herpes zoster.  Reported weakness and dizziness over the last 2 days associated with nausea and vomiting.  Initial physical examination his heart rate was 92, respiratory rate 22, oxygen saturation 98%.  His lungs were clear to auscultation bilaterally, heart S1-S2 present rhythmic, the abdomen was soft, no lower extremity edema.  He had a flat affect, but awake, alert and oriented x3.  Sodium 107, potassium 2.7, chloride 66, bicarb 25, glucose 268, BUN 15, creatinine 0.85, serum osmolarity 235, white count 17.0, hemoglobin 13.9, hematocrit 37.3, platelets 278.  COVID-19 was negative.  Urinary osmolality 381, urine sodium 13.   Patient was admitted to the hospital with a working diagnosis of severe symptomatic hyponatremia.  Patient was treated with hypertonic saline with close Na monitoring, due to rapid overcorrection he received DDAVP and IV fluids were changed to hypotonic saline. Clinically patient has been feeling better, but continue to be very weak and deconditioned.   Assessment & Plan:   Principal Problem:   Acute hyponatremia Active Problems:   Essential hypertension   Zoster   T2DM (type 2 diabetes mellitus) (HCC)   Hypokalemia   Hypomagnesemia   Dyslipidemia   1. Acute symptomatic hypovolemic hyponatremia/ hypokalemia/ hypomagnesemia. Serum Na this am is 121, stable for the last 24, 119 to 121. Patient is sp DDAVP yesterday due to rapid overcorrection. Patient's volume status is improved and is tolerating po well. His urine output over last 24 H is 1,450 ml. Target Na is 129 for the next 24 H. Patient's weight is 83 Kg, with calculated total body water of 49 L, will need 392 meq of Na to  reach 129 in the next 24 H, equivalent to 2.5 Lt of NS in 24 H. Will change fluids to NS at 50 ml/ H for now (1,200 ml = 184 meq Na)  and continue Na monitoring q 4 H. For hypokalemia will order 80 meq of Kcl in 2 divided doses and for hypomagnesemia, will add 2 grams of Mg sulfate IV. Will hold on DDAVP for now. Liberate diet to regular.   Physical therapy evaluation.   2. Suspected pneumonia. Personally reviewed chest film, no infiltrates noted, wbc down to 14,1, cultures with no growth, will discontinue antibiotic therapy for now. Pneumonia ruled out.   3. T2DM with hyperglycemia. Patient is tolerating po well, will continue insulin sliding scale for glucose cover and monitoring.   4. HTN. Holding diuretics due to electrolyte disturbances, blood pressure has remain stable with amlodipine, clonidine and atenolol, continue close monitoring.   5. Dyslipidemia. Continue with statin therapy .   6. Herpes zoster/ facial. Will continue with valtrex for now. Patient is tolerating po well.   DVT prophylaxis: enoxaparin   Code Status:  full Family Communication: no family at the bedside  Disposition Plan/ discharge barriers: continue critically ill   Body mass index is 27.28 kg/m. Malnutrition Type:      Malnutrition Characteristics:      Nutrition Interventions:     RN Pressure Injury Documentation:     Consultants:   Nephrology   Procedures:     Antimicrobials:       Subjective: Patient is feeling better but continue to be very weak and deconditioned, has not been  out of the bed yet.   Objective: Vitals:   11/29/18 0500 11/29/18 0530 11/29/18 0700 11/29/18 0800  BP:  (!) 158/83 (!) 161/81 (!) 170/86  Pulse:  69 69 73  Resp:  16 14 18   Temp:      TempSrc:      SpO2:  97% 97% 96%  Weight: 83.8 kg     Height:        Intake/Output Summary (Last 24 hours) at 11/29/2018 [id number] Last data filed at 11/29/2018 0300 Gross per 24 hour  Intake 1502.54 ml  Output  650 ml  Net 852.54 ml   Filed Weights   11/27/18 1019 11/28/18 0606 11/29/18 0500  Weight: 85.3 kg 83.1 kg 83.8 kg    Examination:   General: Not in pain or dyspnea, deconditioned  Neurology: Awake and alert, non focal  E ENT: mild pallor, no icterus, oral mucosa moist Cardiovascular: No JVD. S1-S2 present, rhythmic, no gallops, rubs, or murmurs. No lower extremity edema. Pulmonary: positive breath sounds bilaterally, adequate air movement, no wheezing, rhonchi or rales. Gastrointestinal. Abdomen with no organomegaly, non tender, no rebound or guarding Skin. No rashes Musculoskeletal: no joint deformities     Data Reviewed: I have personally reviewed following labs and imaging studies  CBC: Recent Labs  Lab 11/27/18 1116 11/28/18 0503 11/29/18 0629  WBC 17.0* 13.3* 14.1*  HGB 13.9 13.2 11.9*  HCT 37.3* 36.8* 33.6*  MCV 82.9 88.0 88.0  PLT 278 217 [id number]   Basic Metabolic Panel: Recent Labs  Lab 11/27/18 1116 11/27/18 1443  11/28/18 0503 11/28/18 1028  11/28/18 2125 11/29/18 0000 11/29/18 0121 11/29/18 0352 11/29/18 0629  NA 107* 107*   < > 121* 126*   < > 120* 119* 119* 121* 121*  K 2.7* 3.1*  --  3.7 3.5  --   --   --   --   --  2.9*  CL 66* 68*  --  89* 92*  --   --   --   --   --  87*  CO2 25 22  --  25 24  --   --   --   --   --  24  GLUCOSE 268* 227*  --  114* 146*  --   --   --   --   --  162*  BUN 15 14  --  13 13  --   --   --   --   --  16  CREATININE 0.85 0.97  --  0.97 0.90  --   --   --   --   --  0.94  CALCIUM 8.9 8.5*  --  8.7* 8.9  --   --   --   --   --  8.5*  MG  --  1.5*  --  1.9  --   --   --   --   --   --  1.8   < > = values in this interval not displayed.   GFR: Estimated Creatinine Clearance: 70 mL/min (by C-G formula based on SCr of 0.94 mg/dL). Liver Function Tests: Recent Labs  Lab 11/27/18 1116  AST 146*  ALT 53*  ALKPHOS 58  BILITOT 1.8*  PROT 7.3  ALBUMIN 4.1   Recent Labs  Lab 11/27/18 1116  LIPASE 19   No results  for input(s): AMMONIA in the last 168 hours. Coagulation Profile: No results for input(s): INR, PROTIME in the last 168 hours. Cardiac Enzymes: No  results for input(s): CKTOTAL, CKMB, CKMBINDEX, TROPONINI in the last 168 hours. BNP (last 3 results) No results for input(s): PROBNP in the last 8760 hours. HbA1C: Recent Labs    11/27/18 1445  HGBA1C 6.8*   CBG: Recent Labs  Lab 11/27/18 2243 11/28/18 0825 11/28/18 1134 11/28/18 1625 11/28/18 2112  GLUCAP 214* 130* 127* 103* 174*   Lipid Profile: No results for input(s): CHOL, HDL, LDLCALC, TRIG, CHOLHDL, LDLDIRECT in the last 72 hours. Thyroid Function Tests: Recent Labs    11/27/18 1443  TSH 0.775   Anemia Panel: No results for input(s): VITAMINB12, FOLATE, FERRITIN, TIBC, IRON, RETICCTPCT in the last 72 hours.    Radiology Studies: I have reviewed all of the imaging during this hospital visit personally     Scheduled Meds:  amLODipine  10 mg Oral Daily   atenolol  100 mg Oral Daily   Chlorhexidine Gluconate Cloth  6 each Topical Daily   cholecalciferol  1,000 Units Oral Daily   cloNIDine  0.2 mg Oral BID   desmopressin  4 mcg Subcutaneous BID   enoxaparin (LOVENOX) injection  40 mg Subcutaneous Q24H   insulin aspart  0-5 Units Subcutaneous QHS   insulin aspart  0-9 Units Subcutaneous TID WC   linaclotide  145 mcg Oral Daily   pantoprazole  40 mg Oral Daily   rosuvastatin  20 mg Oral q1800   sodium chloride flush  10-40 mL Intracatheter Q12H   temazepam  30 mg Oral QHS   valACYclovir  1,000 mg Oral TID   vitamin B-12  1,000 mcg Oral Daily   Continuous Infusions:  sodium chloride Stopped (11/27/18 1915)   azithromycin Stopped (11/28/18 1536)   cefTRIAXone (ROCEPHIN)  IV Stopped (11/28/18 1510)   dextrose 50 mL/hr at 11/29/18 0300     LOS: 2 days        Joseph Gotcher Gerome Apley, MD

## 2018-11-29 NOTE — Progress Notes (Addendum)
CRITICAL VALUE ALERT  Critical Value:  Sodium 118  Date & Time Notied:  11/29/2018 @1325   Provider Notified: Dr. Cathlean Sauer  Orders Received/Actions taken: increased IV fluids to 31ml/hr @1335 

## 2018-11-29 NOTE — Progress Notes (Signed)
CRITICAL VALUE ALERT  Critical Value:  Serum osmolality  Date & Time Notied:  11/29/18 1600  Provider Notified: Dr. Cathlean Sauer

## 2018-11-29 NOTE — Care Management Important Message (Signed)
Important Message  Patient Details  Name: Joseph Wolf MRN: [id number] Date of Birth: [birth year]   Medicare Important Message Given:  Yes     Tommy Medal 11/29/2018, 4:13 PM

## 2018-11-29 NOTE — Progress Notes (Addendum)
Patient with persistent hyponatremia, normal saline has been increased to 75 ml per H and then to 100 ml per H. Continue to follow up electrolytes every 4 H. Continue correction of K with Kcl.  Target increase serum Na 1,3 meq every 4 H.

## 2018-11-29 NOTE — Progress Notes (Signed)
Patient ID: VAIDEN WEE, male   DOB: 06-23-1945, 73 y.o.   MRN: [id number] S: Feels "fair" today.  Sodium has been stabilized overnight and not allowed to further overcorrect.  Still weak but stable. O:BP (!) 153/81   Pulse 82   Temp 98.6 F (37 C) (Oral)   Resp (!) 22   Ht 5\' 9"  (1.753 m)   Wt 83.8 kg   SpO2 96%   BMI 27.28 kg/m   Intake/Output Summary (Last 24 hours) at 11/29/2018 1040 Last data filed at 11/29/2018 0300 Gross per 24 hour  Intake 1502.54 ml  Output 650 ml  Net 852.54 ml   Intake/Output: I/O last 3 completed shifts: In: 2337.5 [P.O.:240; I.V.:1526.9; IV Piggyback:570.5] Out: 1950 [id number]  Intake/Output this shift:  No intake/output data recorded. Weight change: -1.476 kg Gen: fatigued CVS: no rub Resp: cta Abd: +BS, soft, NT/ND Ext: no edema  Recent Labs  Lab 11/27/18 1116 11/27/18 1443  11/28/18 0503 11/28/18 1028  11/28/18 1832 11/28/18 2125 11/29/18 0000 11/29/18 0121 11/29/18 0352 11/29/18 0629 11/29/18 0811  NA 107* 107*   < > 121* 126*   < > 120* 120* 119* 119* 121* 121* 121*  K 2.7* 3.1*  --  3.7 3.5  --   --   --   --   --   --  2.9*  --   CL 66* 68*  --  89* 92*  --   --   --   --   --   --  87*  --   CO2 25 22  --  25 24  --   --   --   --   --   --  24  --   GLUCOSE 268* 227*  --  114* 146*  --   --   --   --   --   --  162*  --   BUN 15 14  --  13 13  --   --   --   --   --   --  16  --   CREATININE 0.85 0.97  --  0.97 0.90  --   --   --   --   --   --  0.94  --   ALBUMIN 4.1  --   --   --   --   --   --   --   --   --   --   --   --   CALCIUM 8.9 8.5*  --  8.7* 8.9  --   --   --   --   --   --  8.5*  --   AST 146*  --   --   --   --   --   --   --   --   --   --   --   --   ALT 53*  --   --   --   --   --   --   --   --   --   --   --   --    < > = values in this interval not displayed.   Liver Function Tests: Recent Labs  Lab 11/27/18 1116  AST 146*  ALT 53*  ALKPHOS 58  BILITOT 1.8*  PROT 7.3  ALBUMIN 4.1    Recent Labs  Lab 11/27/18 1116  LIPASE 19   No results for input(s): AMMONIA in the last  168 hours. CBC: Recent Labs  Lab 11/27/18 1116 11/28/18 0503 11/29/18 0629  WBC 17.0* 13.3* 14.1*  HGB 13.9 13.2 11.9*  HCT 37.3* 36.8* 33.6*  MCV 82.9 88.0 88.0  PLT 278 217 214   Cardiac Enzymes: No results for input(s): CKTOTAL, CKMB, CKMBINDEX, TROPONINI in the last 168 hours. CBG: Recent Labs  Lab 11/28/18 0825 11/28/18 1134 11/28/18 1625 11/28/18 2112 11/29/18 0817  GLUCAP 130* 127* 103* 174* 146*    Iron Studies: No results for input(s): IRON, TIBC, TRANSFERRIN, FERRITIN in the last 72 hours. Studies/Results: Dg Chest Portable 1 View  Result Date: 11/27/2018 CLINICAL DATA:  Weakness with vomiting and dizziness EXAM: PORTABLE CHEST 1 VIEW COMPARISON:  None. FINDINGS: Lordotic projection. Negative for heart failure. Cervical ACDF fusion plate. Possible left lower lobe atelectasis/infiltrate. This is not well evaluated on the current projection. IMPRESSION: Possible left lower lobe atelectasis/infiltrate. If the patient has acute chest symptoms, follow-up chest two-view recommended. Electronically Signed   By: Franchot Gallo M.D.   On: 11/27/2018 13:22   [place] Ekg Site Rite  Result Date: 11/28/2018 If Site Rite image not attached, placement could not be confirmed due to current cardiac rhythm.  [place] Ekg Site Rite  Result Date: 11/27/2018 If Site Rite image not attached, placement could not be confirmed due to current cardiac rhythm.  Marland Kitchen amLODipine  10 mg Oral Daily  . atenolol  100 mg Oral Daily  . Chlorhexidine Gluconate Cloth  6 each Topical Daily  . cholecalciferol  1,000 Units Oral Daily  . cloNIDine  0.2 mg Oral BID  . enoxaparin (LOVENOX) injection  40 mg Subcutaneous Q24H  . insulin aspart  0-5 Units Subcutaneous QHS  . insulin aspart  0-9 Units Subcutaneous TID WC  . linaclotide  145 mcg Oral Daily  . pantoprazole  40 mg Oral Daily  . potassium chloride  40 mEq  Oral Q4H  . rosuvastatin  20 mg Oral q1800  . sodium chloride flush  10-40 mL Intracatheter Q12H  . temazepam  30 mg Oral QHS  . valACYclovir  1,000 mg Oral TID  . vitamin B-12  1,000 mcg Oral Daily    BMET    Component Value Date/Time   NA 121 (L) 11/29/2018 0811   K 2.9 (L) 11/29/2018 0629   CL 87 (L) 11/29/2018 0629   CO2 24 11/29/2018 0629   GLUCOSE 162 (H) 11/29/2018 0629   BUN 16 11/29/2018 0629   CREATININE 0.94 11/29/2018 0629   CALCIUM 8.5 (L) 11/29/2018 0629   GFRNONAA >60 11/29/2018 0629   GFRAA >60 11/29/2018 0629   CBC    Component Value Date/Time   WBC 14.1 (H) 11/29/2018 0629   RBC 3.82 (L) 11/29/2018 0629   HGB 11.9 (L) 11/29/2018 0629   HCT 33.6 (L) 11/29/2018 0629   PLT 214 11/29/2018 0629   MCV 88.0 11/29/2018 0629   MCH 31.2 11/29/2018 0629   MCHC 35.4 11/29/2018 0629   RDW 11.9 11/29/2018 0629    Assessment/Plan: 1. Severe, symptomatic Hyponatremia-  Presumably acute/subacute over the past 2-3 days.  Given low Urine Na and h/o N/V this is most consistent with hypovolemic, hyponatremia, exacerbated by thiazide diuretics.  Given the severity of his hyponatremia, hypertonic saline was recommended with marked improvement but unfortunately had an overcorrection (from 107 to 126 over 24 hours) and required ddAVP and hypotonic saline to stabilize serum sodium levels. 1. Agree with stopping ddAVP and starting IV fluids and follow serum sodium levels closely 2. Have held  his sodium stable at 121 overnight and now will cont to slowly correct sodium levels with IV NS. 3. Chlorthalidone has been held 4. Will recheck urine and serum osm 5. Continue to follow sodium levels and recommend picc line due to frequent labs draws 2. Herpes zoster- per primary svc. 3. Hypokalemia- replete and follow 4. Possible CAP- per prmiary svc.  On rocephin and azithromycin  Donetta Potts, MD [facility] [phone]

## 2018-11-29 NOTE — Progress Notes (Signed)
CRITICAL VALUE ALERT  Critical Value:  Sodium 117  Date & Time Notied:11/29/2018 @1717   Provider Notified:  Dr Cathlean Sauer  Orders Received/Actions taken: Increased IV fluids to 100 ml/hr

## 2018-11-30 DIAGNOSIS — B029 Zoster without complications: Secondary | ICD-10-CM

## 2018-11-30 DIAGNOSIS — I10 Essential (primary) hypertension: Secondary | ICD-10-CM

## 2018-11-30 DIAGNOSIS — E876 Hypokalemia: Secondary | ICD-10-CM

## 2018-11-30 LAB — BASIC METABOLIC PANEL
Anion gap: 4 — ABNORMAL LOW (ref 5–15)
Anion gap: 8 (ref 5–15)
Anion gap: 8 (ref 5–15)
Anion gap: 9 (ref 5–15)
Anion gap: 7 (ref 5–15)
BUN: 12 mg/dL (ref 8–23)
BUN: 11 mg/dL (ref 8–23)
BUN: 10 mg/dL (ref 8–23)
BUN: 10 mg/dL (ref 8–23)
BUN: 8 mg/dL (ref 8–23)
CO2: 23 mmol/L (ref 22–32)
CO2: 23 mmol/L (ref 22–32)
CO2: 22 mmol/L (ref 22–32)
CO2: 20 mmol/L — ABNORMAL LOW (ref 22–32)
CO2: 23 mmol/L (ref 22–32)
Calcium: 7.4 mg/dL — ABNORMAL LOW (ref 8.9–10.3)
Calcium: 8 mg/dL — ABNORMAL LOW (ref 8.9–10.3)
Calcium: 8.1 mg/dL — ABNORMAL LOW (ref 8.9–10.3)
Calcium: 8.4 mg/dL — ABNORMAL LOW (ref 8.9–10.3)
Calcium: 8.4 mg/dL — ABNORMAL LOW (ref 8.9–10.3)
Chloride: 91 mmol/L — ABNORMAL LOW (ref 98–111)
Chloride: 90 mmol/L — ABNORMAL LOW (ref 98–111)
Chloride: 96 mmol/L — ABNORMAL LOW (ref 98–111)
Chloride: 95 mmol/L — ABNORMAL LOW (ref 98–111)
Chloride: 97 mmol/L — ABNORMAL LOW (ref 98–111)
Creatinine, Ser: 0.9 mg/dL (ref 0.61–1.24)
Creatinine, Ser: 0.84 mg/dL (ref 0.61–1.24)
Creatinine, Ser: 0.7 mg/dL (ref 0.61–1.24)
Creatinine, Ser: 0.87 mg/dL (ref 0.61–1.24)
Creatinine, Ser: 0.84 mg/dL (ref 0.61–1.24)
GFR calc Af Amer: >60 mL/min (ref >60)
GFR calc Af Amer: >60 mL/min (ref >60)
GFR calc Af Amer: >60 mL/min (ref >60)
GFR calc Af Amer: >60 mL/min (ref >60)
GFR calc Af Amer: >60 mL/min (ref >60)
GFR calc non Af Amer: >60 mL/min (ref >60)
GFR calc non Af Amer: >60 mL/min (ref >60)
GFR calc non Af Amer: >60 mL/min (ref >60)
GFR calc non Af Amer: >60 mL/min (ref >60)
GFR calc non Af Amer: >60 mL/min (ref >60)
Glucose, Bld: 132 mg/dL — ABNORMAL HIGH (ref 70–99)
Glucose, Bld: 134 mg/dL — ABNORMAL HIGH (ref 70–99)
Glucose, Bld: 208 mg/dL — ABNORMAL HIGH (ref 70–99)
Glucose, Bld: 222 mg/dL — ABNORMAL HIGH (ref 70–99)
Glucose, Bld: 148 mg/dL — ABNORMAL HIGH (ref 70–99)
Potassium: 3.6 mmol/L (ref 3.5–5.1)
Potassium: 3.6 mmol/L (ref 3.5–5.1)
Potassium: 3.6 mmol/L (ref 3.5–5.1)
Potassium: 3.7 mmol/L (ref 3.5–5.1)
Potassium: 3.8 mmol/L (ref 3.5–5.1)
Sodium: 118 mmol/L — CL (ref 135–145)
Sodium: 121 mmol/L — ABNORMAL LOW (ref 135–145)
Sodium: 126 mmol/L — ABNORMAL LOW (ref 135–145)
Sodium: 124 mmol/L — ABNORMAL LOW (ref 135–145)
Sodium: 127 mmol/L — ABNORMAL LOW (ref 135–145)

## 2018-11-30 LAB — GLUCOSE, CAPILLARY
Glucose-Capillary: 136 mg/dL — ABNORMAL HIGH (ref 70–99)
Glucose-Capillary: 221 mg/dL — ABNORMAL HIGH (ref 70–99)
Glucose-Capillary: 134 mg/dL — ABNORMAL HIGH (ref 70–99)
Glucose-Capillary: 212 mg/dL — ABNORMAL HIGH (ref 70–99)

## 2018-11-30 LAB — OSMOLALITY: Osmolality: 268 mOsm/kg — ABNORMAL LOW (ref 275–295)

## 2018-11-30 LAB — MAGNESIUM: Magnesium: 2 mg/dL (ref 1.7–2.4)

## 2018-11-30 MED ORDER — POTASSIUM CHLORIDE CRYS ER 20 MEQ PO TBCR
40.0000 meq | EXTENDED_RELEASE_TABLET | Freq: Once | ORAL | Status: AC
  Administered 2018-11-30: 40 meq via ORAL
  Filled 2018-11-30: qty 2

## 2018-11-30 MED ORDER — HYDRALAZINE HCL 25 MG PO TABS
25.0000 mg | ORAL_TABLET | Freq: Four times a day (QID) | ORAL | Status: DC | PRN
  Administered 2018-11-30: 25 mg via ORAL
  Filled 2018-11-30: qty 1

## 2018-11-30 NOTE — Progress Notes (Signed)
Na has increased from 118 to 121 in 4 hours, will decrease rate of NS to 75 ml/H and will continue q 4h bmp.

## 2018-11-30 NOTE — Progress Notes (Signed)
PROGRESS NOTE    Joseph Wolf  [id number] DOB: 03-08-1945 DOA: 11/27/2018 PCP: Redmond School, MD    Brief Narrative:  73 year old male who presented with weakness and dizziness, he does have significant past medical history for hypertension and dyslipidemia.  Recently diagnosed with herpes zoster.  Reported weakness and dizziness over the last 2 days associated with nausea and vomiting.  Initial physical examination his heart rate was 92, respiratory rate 22, oxygen saturation 98%.  His lungs were clear to auscultation bilaterally, heart S1-S2 present rhythmic, the abdomen was soft, no lower extremity edema.  He had a flat affect, but awake, alert and oriented x3.  Sodium 107, potassium 2.7, chloride 66, bicarb 25, glucose 268, BUN 15, creatinine 0.85, serum osmolarity 235, white count 17.0, hemoglobin 13.9, hematocrit 37.3, platelets 278.  COVID-19 was negative.  Urinary osmolality 381, urine sodium 13.   Patient was admitted to the hospital with a working diagnosis of severe symptomatic hyponatremia.  Patient was treated with hypertonic saline with close Na monitoring, due to rapid overcorrection he received DDAVP and IV fluids were changed to hypotonic saline. Clinically patient has been feeling better, but continue to be very weak and deconditioned.   Assessment & Plan:   Principal Problem:   Acute hyponatremia Active Problems:   Essential hypertension   Zoster   T2DM (type 2 diabetes mellitus) (HCC)   Hypokalemia   Hypomagnesemia   Dyslipidemia   1. Acute symptomatic hypovolemic hyponatremia/ hypokalemia/ hypomagnesemia.  Felt to be related to hypovolemia/hyponatremia in the setting of, nausea, vomiting and thiazide use.  Initially started on hypertonic saline for symptomatic hyponatremia which unfortunately resulted in overcorrection.  He required DDAVP and hypotonic saline to stabilize his serum sodium levels.  Thiazides have been held.  Overall serum sodiums are slowly  improving with IVF.  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  May need to consider demeclocycline if serum sodium levels fall again.  Appreciate nephrology input.  Physical therapy evaluation pending.   2. Suspected pneumonia. Personally reviewed chest film, no infiltrates noted, wbc down to 14,1, cultures with no growth, will discontinue antibiotic therapy for now. Pneumonia ruled out.   3. T2DM with hyperglycemia. Patient is tolerating po well, will continue insulin sliding scale for glucose cover and monitoring.   4. HTN. Holding diuretics due to electrolyte disturbances, blood pressure has remain stable with amlodipine, clonidine and atenolol, continue close monitoring.   5. Dyslipidemia. Continue with statin therapy .   6. Herpes zoster/ facial. Will continue with valtrex for now. Patient is tolerating po well.   DVT prophylaxis: enoxaparin   Code Status:  full Family Communication: no family at the bedside  Disposition Plan/ discharge barriers: Transfer to telemetry bed.  Body mass index is 27.12 kg/m. Malnutrition Type:      Malnutrition Characteristics:      Nutrition Interventions:     RN Pressure Injury Documentation:     Consultants:   Nephrology   Procedures:     Antimicrobials:       Subjective: Denies any pain in lesions on right forehead.  No blurry visions or changes in vision.  Overall he is feeling better since admission.  No vomiting, diarrhea.  No dizziness  Objective: Vitals:   11/30/18 0751 11/30/18 1623 11/30/18 1700 11/30/18 1948  BP:   (!) 172/90   Pulse:  86 98   Resp:  (!) 23 20   Temp: (!) 97.4 F (36.3 C) 99.1 F (37.3 C)    TempSrc: Oral Oral  SpO2:  97% 98% 98%  Weight:   83.3 kg   Height:   5\' 9"  (1.753 m)     Intake/Output Summary (Last 24 hours) at 11/30/2018 2006 Last data filed at 11/30/2018 0750 Gross per 24 hour  Intake --  Output 3500 ml  Net -3500 ml   Filed Weights   11/29/18 0500 11/30/18 0500 11/30/18  1700  Weight: 83.8 kg 85.1 kg 83.3 kg    Examination:   General exam: Alert, awake, oriented x 3 HEENT: Crusted lesions around right forehead and right orbit.  No conjunctival involvement. Respiratory system: Clear to auscultation. Respiratory effort normal. Cardiovascular system:RRR. No murmurs, rubs, gallops. Gastrointestinal system: Abdomen is nondistended, soft and nontender. No organomegaly or masses felt. Normal bowel sounds heard. Central nervous system: Alert and oriented. No focal neurological deficits. Extremities: No C/C/E, +pedal pulses Skin: No rashes, lesions or ulcers Psychiatry: Judgement and insight appear normal. Mood & affect appropriate.       Data Reviewed: I have personally reviewed following labs and imaging studies  CBC: Recent Labs  Lab 11/27/18 1116 11/28/18 0503 11/29/18 0629  WBC 17.0* 13.3* 14.1*  HGB 13.9 13.2 11.9*  HCT 37.3* 36.8* 33.6*  MCV 82.9 88.0 88.0  PLT 278 217 [id number]   Basic Metabolic Panel: Recent Labs  Lab 11/27/18 1443  11/28/18 0503  11/29/18 0629  11/30/18 0033 11/30/18 0039 11/30/18 0417 11/30/18 0930 11/30/18 1236 11/30/18 1642  NA 107*   < > 121*   < > 121*   < > 118*  --  121* 126* 124* 127*  K 3.1*  --  3.7   < > 2.9*   < > 3.6  --  3.6 3.6 3.7 3.8  CL 68*  --  89*   < > 87*   < > 91*  --  90* 96* 95* 97*  CO2 22  --  25   < > 24   < > 23  --  23 22 20* 23  GLUCOSE 227*  --  114*   < > 162*   < > 132*  --  134* 208* 222* 148*  BUN 14  --  13   < > 16   < > 12  --  11 10 10 8   CREATININE 0.97  --  0.97   < > 0.94   < > 0.90  --  0.84 0.70 0.87 0.84  CALCIUM 8.5*  --  8.7*   < > 8.5*   < > 7.4*  --  8.0* 8.1* 8.4* 8.4*  MG 1.5*  --  1.9  --  1.8  --   --  2.0  --   --   --   --    < > = values in this interval not displayed.   GFR: Estimated Creatinine Clearance: 78.3 mL/min (by C-G formula based on SCr of 0.84 mg/dL). Liver Function Tests: Recent Labs  Lab 11/27/18 1116 11/29/18 0458  AST 146* 50*  ALT 53*  39  ALKPHOS 58 47  BILITOT 1.8* 1.1  PROT 7.3 5.9*  ALBUMIN 4.1 3.2*   Recent Labs  Lab 11/27/18 1116  LIPASE 19   No results for input(s): AMMONIA in the last 168 hours. Coagulation Profile: No results for input(s): INR, PROTIME in the last 168 hours. Cardiac Enzymes: No results for input(s): CKTOTAL, CKMB, CKMBINDEX, TROPONINI in the last 168 hours. BNP (last 3 results) No results for input(s): PROBNP in the last 8760 hours. HbA1C: No  results for input(s): HGBA1C in the last 72 hours. CBG: Recent Labs  Lab 11/29/18 1701 11/29/18 2045 11/30/18 0745 11/30/18 1145 11/30/18 1618  GLUCAP 105* 166* 136* 221* 134*   Lipid Profile: No results for input(s): CHOL, HDL, LDLCALC, TRIG, CHOLHDL, LDLDIRECT in the last 72 hours. Thyroid Function Tests: No results for input(s): TSH, T4TOTAL, FREET4, T3FREE, THYROIDAB in the last 72 hours. Anemia Panel: No results for input(s): VITAMINB12, FOLATE, FERRITIN, TIBC, IRON, RETICCTPCT in the last 72 hours.    Radiology Studies: I have reviewed all of the imaging during this hospital visit personally     Scheduled Meds:  amLODipine  10 mg Oral Daily   atenolol  100 mg Oral Daily   Chlorhexidine Gluconate Cloth  6 each Topical Daily   cholecalciferol  1,000 Units Oral Daily   cloNIDine  0.2 mg Oral BID   enoxaparin (LOVENOX) injection  40 mg Subcutaneous Q24H   insulin aspart  0-5 Units Subcutaneous QHS   insulin aspart  0-9 Units Subcutaneous TID WC   linaclotide  145 mcg Oral Daily   pantoprazole  40 mg Oral Daily   rosuvastatin  20 mg Oral q1800   sodium chloride flush  10-40 mL Intracatheter Q12H   temazepam  30 mg Oral QHS   valACYclovir  1,000 mg Oral TID   vitamin B-12  1,000 mcg Oral Daily   Continuous Infusions:  sodium chloride 75 mL/hr at 11/30/18 0951     LOS: 3 days        Kathie Dike, MD

## 2018-11-30 NOTE — Progress Notes (Signed)
Patient ID: Joseph Wolf, male   DOB: 15-Aug-1945, 73 y.o.   MRN: [id number] S: Feels "fair" today, no n/v/d, or SOB. O:BP (!) 160/81   Pulse 72   Temp (!) 97.4 F (36.3 C) (Oral)   Resp 19   Ht 5\' 9"  (1.753 m)   Wt 85.1 kg   SpO2 95%   BMI 27.71 kg/m   Intake/Output Summary (Last 24 hours) at 11/30/2018 0905 Last data filed at 11/30/2018 0750 Gross per 24 hour  Intake 779.6 ml  Output 3500 ml  Net -2720.4 ml   Intake/Output: I/O last 3 completed shifts: In: 1822.1 [P.O.:480; I.V.:1107; IV Piggyback:235.2] Out: 2400 [Urine:2400]  Intake/Output this shift:  Total I/O In: -  Out: 1100 [Urine:1100] Weight change: 1.3 kg Gen: elderly wm in NAD CVS: no rub Resp:cta Abd: +BS, soft, NT/ND Ext: no edema   Recent Labs  Lab 11/27/18 1116  11/28/18 1028  11/29/18 0458 11/29/18 0629 11/29/18 0811 11/29/18 1221 11/29/18 1605 11/29/18 2039 11/30/18 0033 11/30/18 0417  NA 107*   < > 126*   < >  --  121* 121* 118* 117* 118* 118* 121*  K 2.7*   < > 3.5  --   --  2.9*  --  3.3* 3.3* 3.2* 3.6 3.6  CL 66*   < > 92*  --   --  87*  --  84* 86* 86* 91* 90*  CO2 25   < > 24  --   --  24  --  25 23 23 23 23   GLUCOSE 268*   < > 146*  --   --  162*  --  181* 130* 159* 132* 134*  BUN 15   < > 13  --   --  16  --  15 14 13 12 11   CREATININE 0.85   < > 0.90  --   --  0.94  --  0.90 0.84 0.92 0.90 0.84  ALBUMIN 4.1  --   --   --  3.2*  --   --   --   --   --   --   --   CALCIUM 8.9   < > 8.9  --   --  8.5*  --  8.5* 7.8* 7.8* 7.4* 8.0*  AST 146*  --   --   --  50*  --   --   --   --   --   --   --   ALT 53*  --   --   --  39  --   --   --   --   --   --   --    < > = values in this interval not displayed.   Liver Function Tests: Recent Labs  Lab 11/27/18 1116 11/29/18 0458  AST 146* 50*  ALT 53* 39  ALKPHOS 58 47  BILITOT 1.8* 1.1  PROT 7.3 5.9*  ALBUMIN 4.1 3.2*   Recent Labs  Lab 11/27/18 1116  LIPASE 19   No results for input(s): AMMONIA in the last 168  hours. CBC: Recent Labs  Lab 11/27/18 1116 11/28/18 0503 11/29/18 0629  WBC 17.0* 13.3* 14.1*  HGB 13.9 13.2 11.9*  HCT 37.3* 36.8* 33.6*  MCV 82.9 88.0 88.0  PLT 278 217 214   Cardiac Enzymes: No results for input(s): CKTOTAL, CKMB, CKMBINDEX, TROPONINI in the last 168 hours. CBG: Recent Labs  Lab 11/28/18 2112 11/29/18 0817 11/29/18 1158 11/29/18 1701 11/29/18 2045  GLUCAP 174* 146* 181* 105* 166*    Iron Studies: No results for input(s): IRON, TIBC, TRANSFERRIN, FERRITIN in the last 72 hours. Studies/Results: [place] [employer]  Result Date: 11/28/2018 If [employer] not attached, placement could not be confirmed due to current cardiac rhythm.  Marland Kitchen amLODipine  10 mg Oral Daily  . atenolol  100 mg Oral Daily  . Chlorhexidine Gluconate Cloth  6 each Topical Daily  . cholecalciferol  1,000 Units Oral Daily  . cloNIDine  0.2 mg Oral BID  . enoxaparin (LOVENOX) injection  40 mg Subcutaneous Q24H  . insulin aspart  0-5 Units Subcutaneous QHS  . insulin aspart  0-9 Units Subcutaneous TID WC  . linaclotide  145 mcg Oral Daily  . pantoprazole  40 mg Oral Daily  . rosuvastatin  20 mg Oral q1800  . sodium chloride flush  10-40 mL Intracatheter Q12H  . temazepam  30 mg Oral QHS  . valACYclovir  1,000 mg Oral TID  . vitamin B-12  1,000 mcg Oral Daily    BMET    Component Value Date/Time   NA 121 (L) 11/30/2018 0417   K 3.6 11/30/2018 0417   CL 90 (L) 11/30/2018 0417   CO2 23 11/30/2018 0417   GLUCOSE 134 (H) 11/30/2018 0417   BUN 11 11/30/2018 0417   CREATININE 0.84 11/30/2018 0417   CALCIUM 8.0 (L) 11/30/2018 0417   GFRNONAA >60 11/30/2018 0417   GFRAA >60 11/30/2018 0417   CBC    Component Value Date/Time   WBC 14.1 (H) 11/29/2018 0629   RBC 3.82 (L) 11/29/2018 0629   HGB 11.9 (L) 11/29/2018 0629   HCT 33.6 (L) 11/29/2018 0629   PLT 214 11/29/2018 0629   MCV 88.0 11/29/2018 0629   MCH 31.2 11/29/2018 0629   MCHC 35.4 11/29/2018 0629   RDW 11.9  11/29/2018 0629    Assessment/Plan: 1. Severe, symptomatic Hyponatremia- Presumably acute/subacute over the past 2-3 days. Given low Urine Na and h/o N/V this is most consistent with hypovolemic, hyponatremia, exacerbated by thiazide diuretics. Given the severity of his hyponatremia, hypertonic saline was recommended with marked improvement but unfortunately had an overcorrection (from 107 to 126 over 24 hours) and required ddAVP and hypotonic saline to stabilize serum sodium levels. 1. Sodium level slowly improving with IVF's. 2. Chlorthalidone has been held 3. 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 4. Continue to follow sodium levels and recommend picc line due to frequent labs draws 5. Despite IVF"s he continues to be negative over a liter.  We may want to try demeclocycline if his sodium falls again. 2. Herpes zoster- per primary svc. 3. Hypokalemia- replete and follow 4. Abnormal LFTs- improving. 5. Possible CAP- per prmiary svc. On rocephin and azithromycin Donetta Potts, MD [facility] [phone]

## 2018-12-01 LAB — BASIC METABOLIC PANEL
Anion gap: 8 (ref 5–15)
BUN: 9 mg/dL (ref 8–23)
CO2: 21 mmol/L — ABNORMAL LOW (ref 22–32)
Calcium: 8.6 mg/dL — ABNORMAL LOW (ref 8.9–10.3)
Chloride: 101 mmol/L (ref 98–111)
Creatinine, Ser: 1 mg/dL (ref 0.61–1.24)
GFR calc Af Amer: >60 mL/min (ref >60)
GFR calc non Af Amer: >60 mL/min (ref >60)
Glucose, Bld: 169 mg/dL — ABNORMAL HIGH (ref 70–99)
Potassium: 3.6 mmol/L (ref 3.5–5.1)
Sodium: 130 mmol/L — ABNORMAL LOW (ref 135–145)

## 2018-12-01 LAB — CBC
HCT: 33.2 % — ABNORMAL LOW (ref 39.0–52.0)
Hemoglobin: 11.4 g/dL — ABNORMAL LOW (ref 13.0–17.0)
MCH: 31.3 pg (ref 26.0–34.0)
MCHC: 34.3 g/dL (ref 30.0–36.0)
MCV: 91.2 fL (ref 80.0–100.0)
Platelets: 246 10*3/uL (ref 150–400)
RBC: 3.64 MIL/uL — ABNORMAL LOW (ref 4.22–5.81)
RDW: 12.6 % (ref 11.5–15.5)
WBC: 11.6 10*3/uL — ABNORMAL HIGH (ref 4.0–10.5)
nRBC: 0 % (ref 0.0–0.2)

## 2018-12-01 LAB — GLUCOSE, CAPILLARY
Glucose-Capillary: 170 mg/dL — ABNORMAL HIGH (ref 70–99)
Glucose-Capillary: 252 mg/dL — ABNORMAL HIGH (ref 70–99)

## 2018-12-01 MED ORDER — ATENOLOL 100 MG PO TABS: 100.0000 mg | ORAL_TABLET | Freq: Every day | ORAL | 0 refills | Status: DC

## 2018-12-01 NOTE — TOC Progression Note (Signed)
Home Health Care Choices:   [place] [phone]  [place] my Favorites Quality of Patient Care Rating 3 out of 5 stars Patient Survey Summary Rating 5 out of [place] [phone]  [place] my Favorites Quality of Patient Care Rating 2  out of 5 stars Patient Survey Summary Rating 4 out of [place] [phone]  [place] my Favorites Quality of Patient Care Rating 4  out of 5 stars Patient Survey Summary Rating 5 out of 5 stars [place] [phone]  Add [facility] my Favorites Quality of Patient Care Rating 4  out of 5 stars Patient Survey Summary Rating 4 out of 5 stars [place] [phone]  Add [facility] my Favorites Quality of Patient Care Rating 4  out of 5 stars Patient Survey Summary Rating 5 out of 5 stars [place] [phone]  [place] my Favorites Quality of Patient Care Rating 4 out of 5 stars Patient Survey Summary Rating 4 out of 5 stars [place] [phone]  [place] my Favorites Quality of Patient Care Rating 4 out of 5 stars Patient Survey Summary Rating 4 out of 5 stars [place] A[place] [phone]  [place] my Favorites Quality of Patient Care Rating 2  out of 5 stars Patient Survey Summary Rating 5 out of 5 stars [facility] [place] [phone]  Add [facility] [place] my Favorites Quality of Patient Care Rating 3  out of 5 stars Patient Survey Summary Rating 4 out of 5 stars [place] [phone]  [place] my Favorites Quality of Patient Care Rating 3 out of 5 stars Patient Survey Summary Rating 4 out of 5  stars [facility] [phone]  Add [facility] my [facility]avor[facility] Quality of Patient Care Rating 4  out of 5 stars Patient Survey Summary Rating 3 out of [place] [phone]  [place] my Favorites Quality of Patient Care Rating 4  out of 5 stars Patient Survey Summary Rating

## 2018-12-01 NOTE — Progress Notes (Signed)
PICC removed intact 40cm in length and vasoline gauze and tegaderm placed with no bleeding noted.  Discharge papers reviewed with patient and wife, script sent to pharmacy.  Wife to drive home

## 2018-12-01 NOTE — Progress Notes (Signed)
Infection Prevention  Secure Chat to Dr. Roderic Palau  Unit: ICU Regarding:Isolation precautions for shingles  IP Recommendation: If lesions are dry and crusted and no blisters present, airborne/contact can be discontinued. This IP relayed message to ICU RN that  precautions can be d/c'ed.

## 2018-12-01 NOTE — Discharge Summary (Signed)
Physician Discharge Summary  Joseph Wolf [id number] DOB: [birth year] DOA: 11/27/2018  PCP: Redmond School, MD  Admit date: 11/27/2018 Discharge date: 12/01/2018  Admitted From: Home Disposition: Home  Recommendations for Outpatient Follow-up:  1. Follow up with PCP in 1-2 weeks 2. Please obtain BMP/CBC in one week  Home Health: Home health PT Equipment/Devices:  Discharge Condition: Stable CODE STATUS: Full code Diet recommendation: Heart healthy, carb modified  Brief/Interim Summary: 73 year old male who presented with weakness and dizziness, he does have significant past medical history for hypertension and dyslipidemia.  Recently diagnosed with herpes zoster.  Reported weakness and dizziness over the last 2 days associated with nausea and vomiting.  Initial physical examination his heart rate was 92, respiratory rate 22, oxygen saturation 98%.  His lungs were clear to auscultation bilaterally, heart S1-S2 present rhythmic, the abdomen was soft, no lower extremity edema.  He had a flat affect, but awake, alert and oriented x3.  Sodium 107, potassium 2.7, chloride 66, bicarb 25, glucose 268, BUN 15, creatinine 0.85, serum osmolarity 235, white count 17.0, hemoglobin 13.9, hematocrit 37.3, platelets 278.  COVID-19 was negative.  Urinary osmolality 381, urine sodium 13.   Patient was admitted to the hospital with a working diagnosis of severe symptomatic hyponatremia.  Patient was treated with hypertonic saline with close Na monitoring, due to rapid overcorrection he received DDAVP and IV fluids were changed to hypotonic saline. Clinically patient has been feeling better, but continue to be very weak and deconditioned.   Discharge Diagnoses:  Principal Problem:   Acute hyponatremia Active Problems:   Essential hypertension   Zoster   T2DM (type 2 diabetes mellitus) (HCC)   Hypokalemia   Hypomagnesemia   Dyslipidemia  1. Acute symptomatic hypovolemic hyponatremia/  hypokalemia/ hypomagnesemia.  Felt to be related to hypovolemia/hyponatremia in the setting of nausea, vomiting and thiazide use.  Initially started on hypertonic saline for symptomatic hyponatremia which unfortunately resulted in overcorrection.  He required DDAVP and hypotonic saline to stabilize his serum sodium levels.  Thiazides have been held.  He was started on a normal saline infusion and his serum sodium has corrected.  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 Nephrology assistance appreciated. Recommended to discontinue thiazide on discharge.  Physical therapy evaluation recommended HHPT   2. Suspected pneumonia. Personally reviewed chest film, no infiltrates noted, wbc down to 14,1, cultures with no growth, will discontinue antibiotic therapy for now. Pneumonia ruled out.   3. T2DM with hyperglycemia. Patient is tolerating po well, will continue insulin sliding scale for glucose cover and monitoring. Resume metformin on discharge  4. HTN. Holding diuretics due to electrolyte disturbances, blood pressure has remain stable with amlodipine, clonidine and atenolol, continue close monitoring.   5. Dyslipidemia. Continue with statin therapy .   6. Herpes zoster/ facial. Will continue with valtrex for now. Patient is tolerating po well.    Discharge Instructions  Discharge Instructions    Diet - low sodium heart healthy   Complete by: As directed    Increase activity slowly   Complete by: As directed      Allergies as of 12/01/2018      Reactions   Allegra [fexofenadine]    Keflex [cephalexin]       Medication List    STOP taking these medications   atenolol-chlorthalidone 100-25 MG tablet Commonly known as: TENORETIC   methylPREDNISolone 4 MG Tbpk tablet Commonly known as: MEDROL DOSEPAK   potassium chloride SA 20 MEQ tablet Commonly known as: KLOR-CON  TAKE these medications   acetaminophen 325 MG tablet Commonly known as: TYLENOL Take 650 mg by mouth every 6  (six) hours as needed.   ALPRAZolam 0.5 MG tablet Commonly known as: XANAX Take 0.5 mg by mouth 3 (three) times daily as needed for anxiety.   amLODipine 10 MG tablet Commonly known as: NORVASC Take 10 mg by mouth daily.   atenolol 100 MG tablet Commonly known as: TENORMIN Take 1 tablet (100 mg total) by mouth daily. Start taking on: December 02, 2018   Benefiber Drink Mix Pack Take 4 g by mouth at bedtime.   benzonatate 100 MG capsule Commonly known as: TESSALON Take 100 mg by mouth 4 (four) times daily as needed.   cholecalciferol 10 MCG (400 UNIT) Tabs tablet Commonly known as: VITAMIN D3 Take 1,000 Units by mouth.   cloNIDine 0.2 MG tablet Commonly known as: CATAPRES Take 0.2 mg by mouth 2 (two) times daily.   HYDROcodone-acetaminophen 10-325 MG tablet Commonly known as: NORCO Take 1-2 tablets by mouth 4 (four) times daily as needed.   Linzess 145 MCG Caps capsule Generic drug: linaclotide Take 145 mcg by mouth daily.   metFORMIN 850 MG tablet Commonly known as: GLUCOPHAGE Take 850 mg by mouth 2 (two) times daily with a meal.   omeprazole 20 MG capsule Commonly known as: PRILOSEC Take 20 mg by mouth daily.   rosuvastatin 20 MG tablet Commonly known as: CRESTOR Take 20 mg by mouth daily.   temazepam 30 MG capsule Commonly known as: RESTORIL Take 30 mg by mouth at bedtime.   valACYclovir 1000 MG tablet Commonly known as: VALTREX Take 1,000 mg by mouth 3 (three) times daily.   vitamin B-12 1000 MCG tablet Commonly known as: CYANOCOBALAMIN Take 1,000 mcg by mouth daily.      Follow-up Information    Redmond School, MD. Schedule an appointment as soon as possible for a visit in 1 week(s).   Specialty: Internal Medicine Why: recheck sodium level on follow up Contact information: [address] [place] [id number] [phone]        [facility] [place] Follow up.   Specialty: Home Health Services Why: PT - they will call  set up assessment and home PT for 16th-18th         Allergies  Allergen Reactions  . Allegra [Fexofenadine]   . Keflex [Cephalexin]     Consultations:  Nephrology   Procedures/Studies: Dg Chest Portable 1 View  Result Date: 11/27/2018 CLINICAL DATA:  Weakness with vomiting and dizziness EXAM: PORTABLE CHEST 1 VIEW COMPARISON:  None. FINDINGS: Lordotic projection. Negative for heart failure. Cervical ACDF fusion plate. Possible left lower lobe atelectasis/infiltrate. This is not well evaluated on the current projection. IMPRESSION: Possible left lower lobe atelectasis/infiltrate. If the patient has acute chest symptoms, follow-up chest two-view recommended. Electronically Signed   By: Franchot Gallo M.D.   On: 11/27/2018 13:22   [place] Ekg Site Rite  Result Date: 11/28/2018 If Site Rite image not attached, placement could not be confirmed due to current cardiac rhythm.  [place] Ekg Site Rite  Result Date: 11/27/2018 If Site Rite image not attached, placement could not be confirmed due to current cardiac rhythm.      Subjective: Feeling better.  Still feels somewhat generally weak, but improved since admission.  Discharge Exam: Vitals:   11/30/18 1700 11/30/18 1948 11/30/18 2100 12/01/18 0600  BP: (!) 172/90  (!) 176/93 (!) 153/84  Pulse: 98   74  Resp: 20   (!)  22  Temp:   99.4 F (37.4 C) 98.7 F (37.1 C)  TempSrc:    Oral  SpO2: 98% 98% 98% 98%  Weight: 83.3 kg     Height: 5\' 9"  (1.753 m)       General: Pt is alert, awake, not in acute distress Cardiovascular: RRR, S1/S2 +, no rubs, no gallops Respiratory: CTA bilaterally, no wheezing, no rhonchi Abdominal: Soft, NT, ND, bowel sounds + Extremities: no edema, no cyanosis    The results of significant diagnostics from this hospitalization (including imaging, microbiology, ancillary and laboratory) are listed below for reference.     Microbiology: Recent Results (from the past 240 hour(s))  SARS CORONAVIRUS 2  (TAT 6-24 HRS) Nasopharyngeal Nasopharyngeal Swab     Status: None   Collection Time: 11/27/18  1:05 PM   Specimen: Nasopharyngeal Swab  Result Value Ref Range Status   SARS Coronavirus 2 NEGATIVE NEGATIVE Final    Comment: (NOTE) SARS-CoV-2 target nucleic acids are NOT DETECTED. The SARS-CoV-2 RNA is generally detectable in upper and lower respiratory specimens during the acute phase of infection. Negative results do not preclude SARS-CoV-2 infection, do not rule out co-infections with other pathogens, and should not be used as the sole basis for treatment or other patient management decisions. Negative results must be combined with clinical observations, patient history, and epidemiological information. The expected result is Negative. Fact Sheet for Patients: SugarRoll.be Fact Sheet for Healthcare Providers: https://www.woods-mathews.com/ This test is not yet approved or cleared by the [place] FDA and  has been authorized for detection and/or diagnosis of SARS-CoV-2 by FDA under an Emergency Use Authorization (EUA). This EUA will remain  in effect (meaning this test can be used) for the duration of the COVID-19 declaration under Section 56 4(b)(1) of the Act, 21 U.S.C. section 360bbb-3(b)(1), unless the authorization is terminated or revoked sooner. Performed at [facility], [place] [address], [place], [place] [address]   Culture, blood (routine x 2)     Status: None (Preliminary result)   Collection Time: 11/27/18  3:43 PM   Specimen: Right Antecubital; Blood  Result Value Ref Range Status   Specimen Description RIGHT ANTECUBITAL  Final   Special Requests   Final    BOTTLES DRAWN AEROBIC AND ANAEROBIC Blood Culture adequate volume   Culture   Final    NO GROWTH 4 DAYS Performed at [facility], [address] [place], [place] [address]    Report Status PENDING  Incomplete  Culture, blood (routine x 2)     Status: None  (Preliminary result)   Collection Time: 11/27/18  3:44 PM   Specimen: BLOOD LEFT HAND  Result Value Ref Range Status   Specimen Description BLOOD LEFT HAND  Final   Special Requests   Final    BOTTLES DRAWN AEROBIC ONLY Blood Culture results may not be optimal due to an inadequate volume of blood received in culture bottles   Culture   Final    NO GROWTH 4 DAYS Performed at [facility], [address], [place], [place] [address]    Report Status PENDING  Incomplete  MRSA PCR Screening     Status: None   Collection Time: 11/28/18  5:59 AM   Specimen: Nasal Mucosa; Nasopharyngeal  Result Value Ref Range Status   MRSA by PCR NEGATIVE NEGATIVE Final    Comment:        The GeneXpert MRSA Assay (FDA approved for NASAL specimens only), is one component of a comprehensive MRSA  colonization surveillance program. It is not intended to diagnose MRSA infection nor to guide or monitor treatment for MRSA infections. Performed at [facility], [address] [place], [place] [address]      Labs: BNP (last 3 results) No results for input(s): BNP in the last 8760 hours. Basic Metabolic Panel: Recent Labs  Lab 11/27/18 1443  11/28/18 0503  11/29/18 0629  11/30/18 0039 11/30/18 0417 11/30/18 0930 11/30/18 1236 11/30/18 1642 12/01/18 0623  NA 107*   < > 121*   < > 121*   < >  --  121* 126* 124* 127* 130*  K 3.1*  --  3.7   < > 2.9*   < >  --  3.6 3.6 3.7 3.8 3.6  CL 68*  --  89*   < > 87*   < >  --  90* 96* 95* 97* 101  CO2 22  --  25   < > 24   < >  --  23 22 20* 23 21*  GLUCOSE 227*  --  114*   < > 162*   < >  --  134* 208* 222* 148* 169*  BUN 14  --  13   < > 16   < >  --  11 10 10 8 9   CREATININE 0.97  --  0.97   < > 0.94   < >  --  0.84 0.70 0.87 0.84 1.00  CALCIUM 8.5*  --  8.7*   < > 8.5*   < >  --  8.0* 8.1* 8.4* 8.4* 8.6*  MG 1.5*  --  1.9  --  1.8  --  2.0  --   --   --   --   --    < > = values in this interval not displayed.   Liver Function Tests: Recent Labs  Lab  11/27/18 1116 11/29/18 0458  AST 146* 50*  ALT 53* 39  ALKPHOS 58 47  BILITOT 1.8* 1.1  PROT 7.3 5.9*  ALBUMIN 4.1 3.2*   Recent Labs  Lab 11/27/18 1116  LIPASE 19   No results for input(s): AMMONIA in the last 168 hours. CBC: Recent Labs  Lab 11/27/18 1116 11/28/18 0503 11/29/18 0629 12/01/18 0623  WBC 17.0* 13.3* 14.1* 11.6*  HGB 13.9 13.2 11.9* 11.4*  HCT 37.3* 36.8* 33.6* 33.2*  MCV 82.9 88.0 88.0 91.2  PLT 278 217 214 246   Cardiac Enzymes: No results for input(s): CKTOTAL, CKMB, CKMBINDEX, TROPONINI in the last 168 hours. BNP: Invalid input(s): POCBNP CBG: Recent Labs  Lab 11/30/18 1145 11/30/18 1618 11/30/18 2210 12/01/18 0749 12/01/18 1116  GLUCAP 221* 134* 212* 170* 252*   D-Dimer No results for input(s): DDIMER in the last 72 hours. Hgb A1c No results for input(s): HGBA1C in the last 72 hours. Lipid Profile No results for input(s): CHOL, HDL, LDLCALC, TRIG, CHOLHDL, LDLDIRECT in the last 72 hours. Thyroid function studies No results for input(s): TSH, T4TOTAL, T3FREE, THYROIDAB in the last 72 hours.  Invalid input(s): FREET3 Anemia work up No results for input(s): VITAMINB12, FOLATE, FERRITIN, TIBC, IRON, RETICCTPCT in the last 72 hours. Urinalysis    Component Value Date/Time   COLORURINE STRAW (A) 11/27/2018 1038   APPEARANCEUR CLEAR 11/27/2018 1038   LABSPEC 1.006 11/27/2018 1038   PHURINE 7.0 11/27/2018 1038   GLUCOSEU 150 (A) 11/27/2018 1038   HGBUR LARGE (A) 11/27/2018 1038   BILIRUBINUR NEGATIVE 11/27/2018 1038   [place] 11/27/2018 1038  PROTEINUR 100 (A) 11/27/2018 1038   NITRITE NEGATIVE 11/27/2018 1038   LEUKOCYTESUR NEGATIVE 11/27/2018 1038   Sepsis Labs Invalid input(s): PROCALCITONIN,  WBC,  LACTICIDVEN Microbiology Recent Results (from the past 240 hour(s))  SARS CORONAVIRUS 2 (TAT 6-24 HRS) Nasopharyngeal Nasopharyngeal Swab     Status: None   Collection Time: 11/27/18  1:05 PM   Specimen: Nasopharyngeal  Swab  Result Value Ref Range Status   SARS Coronavirus 2 NEGATIVE NEGATIVE Final    Comment: (NOTE) SARS-CoV-2 target nucleic acids are NOT DETECTED. The SARS-CoV-2 RNA is generally detectable in upper and lower respiratory specimens during the acute phase of infection. Negative results do not preclude SARS-CoV-2 infection, do not rule out co-infections with other pathogens, and should not be used as the sole basis for treatment or other patient management decisions. Negative results must be combined with clinical observations, patient history, and epidemiological information. The expected result is Negative. Fact Sheet for Patients: SugarRoll.be Fact Sheet for Healthcare Providers: https://www.woods-mathews.com/ This test is not yet approved or cleared by the [place] FDA and  has been authorized for detection and/or diagnosis of SARS-CoV-2 by FDA under an Emergency Use Authorization (EUA). This EUA will remain  in effect (meaning this test can be used) for the duration of the COVID-19 declaration under Section 56 4(b)(1) of the Act, 21 U.S.C. section 360bbb-3(b)(1), unless the authorization is terminated or revoked sooner. Performed at [facility], [place] [address] [place], [place] [address]   Culture, blood (routine x 2)     Status: None (Preliminary result)   Collection Time: 11/27/18  3:43 PM   Specimen: Right Antecubital; Blood  Result Value Ref Range Status   Specimen Description RIGHT ANTECUBITAL  Final   Special Requests   Final    BOTTLES DRAWN AEROBIC AND ANAEROBIC Blood Culture adequate volume   Culture   Final    NO GROWTH 4 DAYS Performed at [facility], [address] [place], [place] [address]    Report Status PENDING  Incomplete  Culture, blood (routine x 2)     Status: None (Preliminary result)   Collection Time: 11/27/18  3:44 PM   Specimen: BLOOD LEFT HAND  Result Value Ref Range Status   Specimen  Description BLOOD LEFT HAND  Final   Special Requests   Final    BOTTLES DRAWN AEROBIC ONLY Blood Culture results may not be optimal due to an inadequate volume of blood received in culture bottles   Culture   Final    NO GROWTH 4 DAYS Performed at [facility], [address] [place], [place] [address]    Report Status PENDING  Incomplete  MRSA PCR Screening     Status: None   Collection Time: 11/28/18  5:59 AM   Specimen: Nasal Mucosa; Nasopharyngeal  Result Value Ref Range Status   MRSA by PCR NEGATIVE NEGATIVE Final    Comment:        The GeneXpert MRSA Assay (FDA approved for NASAL specimens only), is one component of a comprehensive MRSA colonization surveillance program. It is not intended to diagnose MRSA infection nor to guide or monitor treatment for MRSA infections. Performed at [facility], [address], [place], [place] [address]      Time coordinating discharge: 33mins  SIGNED:   Kathie Dike, MD  [facility] 12/01/2018, 8:51 PM   If 7PM-7AM, please contact night-coverage [email or web address]

## 2018-12-01 NOTE — TOC Transition Note (Signed)
Transition of Care [facility]) - CM/SW Discharge Note   Patient Details  Name: Joseph Wolf MRN: [id number] Date of Birth: [birth year]  Transition of Care [facility]) CM/SW Contact:  Boneta Lucks, RN Phone Number: 12/01/2018, 11:17 AM   Clinical Narrative:   Patient admitted for hyponatremia, discharging home today with his wife. PT recommending HHPT. Choices given.  [place] with [facility], she took the referral for HHPT,  Added to AVS, it will be Nov 16-18th for their assessment.     Barriers to Discharge: Barriers Resolved   Patient Goals and CMS Choice Patient states their goals for this hospitalization and ongoing recovery are:: to go home. CMS Medicare.gov Compare Post Acute Care list provided to:: Patient Represenative (must comment) Choice offered to / list presented to : Spouse    Discharge Plan and Services     Post Acute Care Choice: Home Health           Expected Discharge Plan: [place] Barriers to Discharge: Barriers Resolved   Patient Goals and CMS Choice Patient states their goals for this hospitalization and ongoing recovery are:: to go home. CMS Medicare.gov Compare Post Acute Care list provided to:: Patient Represenative (must comment) Choice offered to / list presented to : Spouse  Expected Discharge Plan and Services Expected Discharge Plan: [place] Acute Care Choice: [place] arrangements for the past 2 months: [place] Expected Discharge Date: 12/01/18                  HH Arranged: PT [place] Agency: [facility] ([place]) Date [place]: 12/01/18 Time [place]: 1117 Representative spoke with at [place]: [place] Arrangements/Services Living arrangements for the past 2 months: [place] with:: Spouse   Do you feel safe going back to the place where you live?: Yes      Need for Family Participation in  Patient Care: Yes (Comment) Care giver support system in place?: Yes (comment) Current home services: DME Criminal Activity/Legal Involvement Pertinent to Current Situation/Hospitalization: No - Comment as needed  Activities of Daily Living Home Assistive Devices/Equipment: Eyeglasses ADL Screening (condition at time of admission) Patient's cognitive ability adequate to safely complete daily activities?: No Is the patient deaf or have difficulty hearing?: No Does the patient have difficulty seeing, even when wearing glasses/contacts?: No Does the patient have difficulty concentrating, remembering, or making decisions?: Yes Patient able to express need for assistance with ADLs?: No Does the patient have difficulty dressing or bathing?: No Independently performs ADLs?: Yes (appropriate for developmental age) Does the patient have difficulty walking or climbing stairs?: No Weakness of Legs: Both Weakness of Arms/Hands: None  Permission Sought/Granted   Permission granted to share information with : Yes, Verbal Permission Granted        Permission granted to share info w Relationship: Wife     Emotional Assessment     Affect (typically observed): Accepting Orientation: : Oriented to Self, Oriented to Place, Oriented to  Time, Oriented to Situation Alcohol / Substance Use: Not Applicable Psych Involvement: No (comment)  Admission diagnosis:  Hyponatremia [E87.1] Patient Active Problem List   Diagnosis Date Noted  . Zoster 11/29/2018  . T2DM (type 2 diabetes mellitus) ([place]) 11/29/2018  . Hypokalemia 11/29/2018  . Hypomagnesemia 11/29/2018  . Dyslipidemia 11/29/2018  . Acute hyponatremia 11/27/2018  . Essential hypertension 01/22/2015  . High cholesterol 01/22/2015  PCP:  Redmond School, MD Pharmacy:   [facility] [address] [place] [address] Phone: [phone] Fax:  [phone]   Readmission Risk Interventions No flowsheet data found.

## 2018-12-01 NOTE — Progress Notes (Signed)
Patient ID: Joseph Wolf, male   DOB: [birth year], 72 y.o.   MRN: [id number] S: Feels better O:BP (!) 153/84 (BP Location: Left Arm)   Pulse 74   Temp 98.7 F (37.1 C) (Oral)   Resp (!) 22   Ht 5\' 9"  (1.753 m)   Wt 83.3 kg   SpO2 98%   BMI 27.12 kg/m   Intake/Output Summary (Last 24 hours) at 12/01/2018 [id number] Last data filed at 12/01/2018 [id number] Gross per 24 hour  Intake -  Output 500 ml  Net -500 ml   Intake/Output: I/O last 3 completed shifts: In: -  Out: 4000 [Urine:4000]  Intake/Output this shift:  No intake/output data recorded. Weight change: -1.8 kg Gen: NAD CVS: no rub Resp:cta Abd:+BS, soft, NT/ND Ext: no edema  Recent Labs  Lab 11/27/18 1116  11/29/18 0458  11/29/18 2039 11/30/18 0033 11/30/18 0417 11/30/18 0930 11/30/18 1236 11/30/18 1642 12/01/18 0623  NA 107*   < >  --    < > 118* 118* 121* 126* 124* 127* 130*  K 2.7*   < >  --    < > 3.2* 3.6 3.6 3.6 3.7 3.8 3.6  CL 66*   < >  --    < > 86* 91* 90* 96* 95* 97* 101  CO2 25   < >  --    < > 23 23 23 22  20* 23 21*  GLUCOSE 268*   < >  --    < > 159* 132* 134* 208* 222* 148* 169*  BUN 15   < >  --    < > 13 12 11 10 10 8 9   CREATININE 0.85   < >  --    < > 0.92 0.90 0.84 0.70 0.87 0.84 1.00  ALBUMIN 4.1  --  3.2*  --   --   --   --   --   --   --   --   CALCIUM 8.9   < >  --    < > 7.8* 7.4* 8.0* 8.1* 8.4* 8.4* 8.6*  AST 146*  --  50*  --   --   --   --   --   --   --   --   ALT 53*  --  39  --   --   --   --   --   --   --   --    < > = values in this interval not displayed.   Liver Function Tests: Recent Labs  Lab 11/27/18 1116 11/29/18 0458  AST 146* 50*  ALT 53* 39  ALKPHOS 58 47  BILITOT 1.8* 1.1  PROT 7.3 5.9*  ALBUMIN 4.1 3.2*   Recent Labs  Lab 11/27/18 1116  LIPASE 19   No results for input(s): AMMONIA in the last 168 hours. CBC: Recent Labs  Lab 11/27/18 1116 11/28/18 0503 11/29/18 0629 12/01/18 0623  WBC 17.0* 13.3* 14.1* 11.6*  HGB 13.9 13.2 11.9* 11.4*  HCT 37.3*  36.8* 33.6* 33.2*  MCV 82.9 88.0 88.0 91.2  PLT 278 217 214 246   Cardiac Enzymes: No results for input(s): CKTOTAL, CKMB, CKMBINDEX, TROPONINI in the last 168 hours. CBG: Recent Labs  Lab 11/30/18 0745 11/30/18 1145 11/30/18 1618 11/30/18 2210 12/01/18 0749  GLUCAP 136* 221* 134* 212* 170*    Iron Studies: No results for input(s): IRON, TIBC, TRANSFERRIN, FERRITIN in the last 72 hours. Studies/Results: No results found. Marland Kitchen amLODipine  10  mg Oral Daily  . atenolol  100 mg Oral Daily  . Chlorhexidine Gluconate Cloth  6 each Topical Daily  . cholecalciferol  1,000 Units Oral Daily  . cloNIDine  0.2 mg Oral BID  . enoxaparin (LOVENOX) injection  40 mg Subcutaneous Q24H  . insulin aspart  0-5 Units Subcutaneous QHS  . insulin aspart  0-9 Units Subcutaneous TID WC  . linaclotide  145 mcg Oral Daily  . pantoprazole  40 mg Oral Daily  . rosuvastatin  20 mg Oral q1800  . sodium chloride flush  10-40 mL Intracatheter Q12H  . temazepam  30 mg Oral QHS  . valACYclovir  1,000 mg Oral TID  . vitamin B-12  1,000 mcg Oral Daily    BMET    Component Value Date/Time   NA 130 (L) 12/01/2018 0623   K 3.6 12/01/2018 0623   CL 101 12/01/2018 0623   CO2 21 (L) 12/01/2018 0623   GLUCOSE 169 (H) 12/01/2018 0623   BUN 9 12/01/2018 0623   CREATININE 1.00 12/01/2018 0623   CALCIUM 8.6 (L) 12/01/2018 0623   GFRNONAA >60 12/01/2018 0623   GFRAA >60 12/01/2018 0623   CBC    Component Value Date/Time   WBC 11.6 (H) 12/01/2018 0623   RBC 3.64 (L) 12/01/2018 0623   HGB 11.4 (L) 12/01/2018 0623   HCT 33.2 (L) 12/01/2018 0623   PLT 246 12/01/2018 0623   MCV 91.2 12/01/2018 0623   MCH 31.3 12/01/2018 0623   MCHC 34.3 12/01/2018 0623   RDW 12.6 12/01/2018 0623     Assessment/Plan: 1. Severe, symptomatic Hyponatremia- Presumably acute/subacute over the past 2-3 days. Given low Urine Na and h/o N/V this is most consistent with hypovolemic, hyponatremia, exacerbated by thiazide diuretics.  Given the severity of his hyponatremia, hypertonic saline was recommended with marked improvementbut unfortunately had an overcorrection (from 107 to 126 over 24 hours) and required ddAVP and hypotonic saline to stabilize serum sodium levels. 1. Sodium level slowly improving with IVF's. 2. Chlorthalidone has been held 3. Nothing further to add and will sign off.  No need for outpatient follow up if sodium levels normalize, please call with questions or concerns 4. Would not resume thiazide diuretics. 2. Herpes zoster- per primary svc. 3. Hypokalemia- repleteand follow 4. Abnormal LFTs- improving. 5. Possible CAP- per prmiary svc. On rocephin and azithromycin 6. Disposition- f/u with PCP to recheck sodium levels after discharge.   Donetta Potts, MD [employer] [phone]

## 2018-12-01 NOTE — Evaluation (Signed)
Physical Therapy Evaluation Patient Details Name: Joseph Wolf MRN: [id number] DOB: January 01, 1946 Today's Date: 12/01/2018   History of Present Illness  Joseph Wolf is a 73 y.o. male with medical history significant for hypertension, dyslipidemia, and recent diagnosis of shingles who presented to the emergency department with weakness and dizziness over the last couple days as well as an episode of vomiting earlier today.  He was recently noted to have a rash over his scalp as well as some blistering on his lower lip and was diagnosed with shingles and started on Valtrex as well as steroid pack and given some narcotic pain medications.  He is good about taking his home medications which includes antihypertensives as well as a diuretic, chlorthalidone.  Since he has not been feeling too well he has been drinking some increased amount of fluids in the last 2 days.  This includes about 3 bottles of water as well as some [employer] and other beverages.  He continued to feel weak for which he presented to the ED.  He denies any fevers, chills, shortness of breath, chest pain or any other symptoms.    Clinical Impression  Patient functioning near baseline for functional mobility and gait, has to lean on nearby objects for support due to generalized weakness requiring occasional hand held assistance, ambulated in hallway without loss of balance and tolerated sitting up in chair after therapy with his spouse present.  Plan:  Patient discharged from physical therapy to care of nursing for ambulation daily as tolerated for length of stay.     Follow Up Recommendations Home health PT;Supervision for mobility/OOB;Supervision - Intermittent    Equipment Recommendations  None recommended by PT    Recommendations for Other Services       Precautions / Restrictions Precautions Precautions: Fall Restrictions Weight Bearing Restrictions: No      Mobility  Bed Mobility Overal bed mobility: Modified  Independent             General bed mobility comments: increased time  Transfers Overall transfer level: Needs assistance Equipment used: 1 person hand held assist;None Transfers: Sit to/from [facility] Sit to Stand: Min guard Stand pivot transfers: Min guard       General transfer comment: slightly labored with tendency to lean on nearby objects for support  Ambulation/Gait Ambulation/Gait assistance: Supervision Gait Distance (Feet): 75 Feet Assistive device: 1 person hand held assist;None Gait Pattern/deviations: Decreased step length - right;Decreased step length - left;Decreased stride length Gait velocity: decreased   General Gait Details: slightly labored cadence without loss of balance, tends to lean on nearby objects for support requiring occasional hand held assist  Stairs            Wheelchair Mobility    Modified Rankin (Stroke Patients Only)       Balance Overall balance assessment: Needs assistance Sitting-balance support: Feet supported;No upper extremity supported Sitting balance-Leahy Scale: Good     Standing balance support: During functional activity;No upper extremity supported Standing balance-Leahy Scale: Fair Standing balance comment: fair/good with hand held assist on leaning on side rails                             Pertinent Vitals/Pain Pain Assessment: No/denies pain    Home Living Family/patient expects to be discharged to:: Private residence Living Arrangements: Spouse/significant other Available Help at Discharge: Family;Available 24 hours/day Type of Home: House Home Access: Stairs to enter Entrance Stairs-Rails:  Right;Left;Can reach both Entrance Stairs-Number of Steps: 4 Home Layout: Able to live on main level with bedroom/bathroom;Two level Home Equipment: Cane - single point      Prior Function                 Hand Dominance        Extremity/Trunk Assessment   Upper  Extremity Assessment Upper Extremity Assessment: Overall WFL for tasks assessed    Lower Extremity Assessment Lower Extremity Assessment: Generalized weakness    Cervical / Trunk Assessment Cervical / Trunk Assessment: Normal  Communication      Cognition Arousal/Alertness: Awake/alert Behavior During Therapy: WFL for tasks assessed/performed Overall Cognitive Status: Within Functional Limits for tasks assessed                                        General Comments      Exercises     Assessment/Plan    PT Assessment All further PT needs can be met in the next venue of care  PT Problem List Decreased strength;Decreased activity tolerance;Decreased balance;Decreased mobility       PT Treatment Interventions      PT Goals (Current goals can be found in the Care Plan section)  Acute Rehab PT Goals Patient Stated Goal: return home with family to assist PT Goal Formulation: With patient/family Time For Goal Achievement: 12/01/18 Potential to Achieve Goals: Good    Frequency     Barriers to discharge        Co-evaluation               AM-PAC PT "6 Clicks" Mobility  Outcome Measure Help needed turning from your back to your side while in a flat bed without using bedrails?: None Help needed moving from lying on your back to sitting on the side of a flat bed without using bedrails?: None Help needed moving to and from a bed to a chair (including a wheelchair)?: A Little Help needed standing up from a chair using your arms (e.g., wheelchair or bedside chair)?: A Little Help needed to walk in hospital room?: A Little Help needed climbing 3-5 steps with a railing? : A Little 6 Click Score: 20    End of Session Equipment Utilized During Treatment: Gait belt Activity Tolerance: Patient tolerated treatment well;Patient limited by fatigue Patient left: in chair;with call bell/phone within reach;with family/visitor present Nurse Communication:  Mobility status PT Visit Diagnosis: Unsteadiness on feet (R26.81);Other abnormalities of gait and mobility (R26.89);Muscle weakness (generalized) (M62.81)    Time: 5670-1410 PT Time Calculation (min) (ACUTE ONLY): 23 min   Charges:   PT Evaluation $PT Eval Moderate Complexity: 1 Mod PT Treatments $Therapeutic Activity: 23-37 mins        2:01 PM, 12/01/18 Lonell Grandchild, MPT Physical Therapist with [facility] [phone] office [phone] mobile phone

## 2018-12-02 LAB — CULTURE, BLOOD (ROUTINE X 2)
Culture: NO GROWTH
Culture: NO GROWTH
Special Requests: ADEQUATE

## 2019-06-12 ENCOUNTER — Other Ambulatory Visit (HOSPITAL_COMMUNITY): Payer: Self-pay | Admitting: Nephrology

## 2019-06-12 ENCOUNTER — Other Ambulatory Visit: Payer: Self-pay | Admitting: Nephrology

## 2019-06-12 DIAGNOSIS — E1122 Type 2 diabetes mellitus with diabetic chronic kidney disease: Secondary | ICD-10-CM

## 2019-06-12 DIAGNOSIS — N170 Acute kidney failure with tubular necrosis: Secondary | ICD-10-CM

## 2019-06-19 ENCOUNTER — Other Ambulatory Visit: Payer: Self-pay

## 2019-06-19 ENCOUNTER — Ambulatory Visit (HOSPITAL_COMMUNITY)
Admission: RE | Admit: 2019-06-19 | Discharge: 2019-06-19 | Disposition: A | Discharge status: Home / Self Care | Payer: Medicare Other | Source: Ambulatory Visit | Attending: Nephrology | Admitting: Nephrology

## 2019-06-19 DIAGNOSIS — N170 Acute kidney failure with tubular necrosis: Secondary | ICD-10-CM

## 2019-06-19 DIAGNOSIS — E1122 Type 2 diabetes mellitus with diabetic chronic kidney disease: Secondary | ICD-10-CM

## 2019-07-09 LAB — HM HEPATITIS C SCREENING LAB: HM Hepatitis Screen: NEGATIVE

## 2019-09-18 ENCOUNTER — Encounter (INDEPENDENT_AMBULATORY_CARE_PROVIDER_SITE_OTHER): Payer: Self-pay | Admitting: Gastroenterology

## 2019-10-11 ENCOUNTER — Telehealth (INDEPENDENT_AMBULATORY_CARE_PROVIDER_SITE_OTHER): Payer: Self-pay | Admitting: *Deleted

## 2019-10-11 ENCOUNTER — Other Ambulatory Visit (INDEPENDENT_AMBULATORY_CARE_PROVIDER_SITE_OTHER): Payer: Self-pay | Admitting: *Deleted

## 2019-10-11 ENCOUNTER — Encounter (INDEPENDENT_AMBULATORY_CARE_PROVIDER_SITE_OTHER): Payer: Self-pay | Admitting: *Deleted

## 2019-10-11 ENCOUNTER — Encounter (INDEPENDENT_AMBULATORY_CARE_PROVIDER_SITE_OTHER): Payer: Self-pay | Admitting: Gastroenterology

## 2019-10-11 ENCOUNTER — Other Ambulatory Visit: Payer: Self-pay

## 2019-10-11 ENCOUNTER — Ambulatory Visit (INDEPENDENT_AMBULATORY_CARE_PROVIDER_SITE_OTHER): Payer: Medicare Other | Admitting: Gastroenterology

## 2019-10-11 VITALS — BP 148/76 | HR 90 | Temp 96.8°F | Ht 69.0 in | Wt 181.0 lb

## 2019-10-11 DIAGNOSIS — R194 Change in bowel habit: Secondary | ICD-10-CM | POA: Diagnosis not present

## 2019-10-11 MED ORDER — LINACLOTIDE 72 MCG PO CAPS: 72.0000 ug | ORAL_CAPSULE | Freq: Every day | ORAL | 3 refills | Status: DC

## 2019-10-11 MED ORDER — PLENVU 140 G PO SOLR: 1.0000 | Freq: Once | ORAL | 0 refills | Status: AC

## 2019-10-11 NOTE — Progress Notes (Signed)
Patient profile: Joseph Wolf is a 74 y.o. male seen for evaluation of change in stools. He was last seen 2017.  History of Present Illness: Joseph Wolf is seen today for evaluation of change in bowel habits.  He reports symptoms initially began about a year ago.  He is having a bowel movement every 4 to 5 days.  He tried Linzess 145 mcg but this caused diarrhea.  He is now just using Linzess 145 as needed.  He did add flaxseed 3 times a day which has improved stools to every other day or every third day.  He does endorse some small caliber stools.  He denies any blood in his stool.  He has some abdominal pain that may relates to bloating-worse if eats a lot of vegetables.  He has some occasional GERD symptoms but this is infrequent.  He is on omeprazole 20 mg once each morning.  He denies any nausea vomiting or dysphagia.  He lost some weight when he was diagnosed with shingles last November and hospitalized.  He feels that he feels better at lower weight and has not try to regain weight.  Denies any family history of colon cancer.  His father had pancreatic cancer.  Former smoker, quit 20 years ago.  No alcohol over the past 20 years.  Tries to avoid Advil given chronic kidney disease, mainly uses Tylenol with seldom advil use.  He infrequently uses Norco.  Wt Readings from Last 3 Encounters:  10/11/19 181 lb (82.1 kg)  11/30/18 183 lb 10.3 oz (83.3 kg)  02/26/15 193 lb (87.5 kg)     Last Colonoscopy: 02/2015--Findings:   Prep satisfactory. Redundant colon with scattered diverticula at sigmoid colon and hepatic flexure. 4 mm polyp cold snared from ascending colon. Normal rectal mucosa. Prominent hemorrhoids noted below the dentate line.  Patient had single small polyp removed and it is tubular adenoma. Next colonoscopy in 7 years.      Past Medical History:  Past Medical History:  Diagnosis Date  . High cholesterol   . Hypertension   . PONV (postoperative nausea and vomiting)      Problem List: Patient Active Problem List   Diagnosis Date Noted  . Zoster 11/29/2018  . T2DM (type 2 diabetes mellitus) ([place]) 11/29/2018  . Hypokalemia 11/29/2018  . Hypomagnesemia 11/29/2018  . Dyslipidemia 11/29/2018  . Acute hyponatremia 11/27/2018  . Essential hypertension 01/22/2015  . High cholesterol 01/22/2015    Past Surgical History: Past Surgical History:  Procedure Laterality Date  . BACK SURGERY     2014. Has had four back surgeryes total  . COLONOSCOPY N/A 02/26/2015   Procedure: COLONOSCOPY;  Surgeon: Rogene Houston, MD;  Location: [facility];  Service: Endoscopy;  Laterality: N/A;  1:00  . KNEE SURGERY     Left arthro    Allergies: Allergies  Allergen Reactions  . Allegra [Fexofenadine]   . Keflex [Cephalexin]       Home Medications:  Current Outpatient Medications:  .  acetaminophen (TYLENOL) 325 MG tablet, Take 650 mg by mouth every 6 (six) hours as needed., Disp: , Rfl:  .  ALPRAZolam (XANAX) 0.5 MG tablet, Take 0.5 mg by mouth 3 (three) times daily as needed for anxiety. , Disp: , Rfl:  .  amLODipine (NORVASC) 10 MG tablet, Take 10 mg by mouth daily., Disp: , Rfl:  .  atenolol (TENORMIN) 100 MG tablet, Take 1 tablet (100 mg total) by mouth daily., Disp: 30 tablet, Rfl: 0 .  cloNIDine (CATAPRES) 0.2 MG tablet, Take 0.2 mg by mouth 2 (two) times daily., Disp: , Rfl:  .  Flaxseed, Linseed, (FLAX SEED OIL PO), Take by mouth daily., Disp: , Rfl:  .  losartan (COZAAR) 100 MG tablet, Take 100 mg by mouth daily., Disp: , Rfl:  .  metFORMIN (GLUCOPHAGE) 850 MG tablet, Take 850 mg by mouth 2 (two) times daily with a meal., Disp: , Rfl:  .  omeprazole (PRILOSEC) 20 MG capsule, Take 20 mg by mouth daily., Disp: , Rfl:  .  potassium chloride (KLOR-CON) 10 MEQ tablet, Take 10 mEq by mouth 2 (two) times daily., Disp: , Rfl:  .  rosuvastatin (CRESTOR) 20 MG tablet, Take 20 mg by mouth daily., Disp: , Rfl:  .  temazepam (RESTORIL) 30 MG capsule, Take 30 mg by  mouth at bedtime. , Disp: , Rfl:  .  benzonatate (TESSALON) 100 MG capsule, Take 100 mg by mouth 4 (four) times daily as needed. (Patient not taking: Reported on 10/11/2019), Disp: , Rfl:  .  cholecalciferol (VITAMIN D) 400 units TABS tablet, Take 1,000 Units by mouth. (Patient not taking: Reported on 10/11/2019), Disp: , Rfl:  .  HYDROcodone-acetaminophen (NORCO) 10-325 MG tablet, Take 1-2 tablets by mouth 4 (four) times daily as needed. (Patient not taking: Reported on 10/11/2019), Disp: , Rfl:  .  linaclotide (LINZESS) 72 MCG capsule, Take 1 capsule (72 mcg total) by mouth daily before breakfast., Disp: 30 capsule, Rfl: 3 .  valACYclovir (VALTREX) 1000 MG tablet, Take 1,000 mg by mouth 3 (three) times daily., Disp: , Rfl:  .  vitamin B-12 (CYANOCOBALAMIN) 1000 MCG tablet, Take 1,000 mcg by mouth daily. (Patient not taking: Reported on 10/11/2019), Disp: , Rfl:  .  Wheat Dextrin (BENEFIBER DRINK MIX) PACK, Take 4 g by mouth at bedtime. (Patient not taking: Reported on 11/27/2018), Disp: , Rfl:    Family History: family history includes Ulcerative colitis in his paternal grandmother.    Social History:   reports that he has quit smoking. His smoking use included cigarettes. He has never used smokeless tobacco. He reports that he does not drink alcohol and does not use drugs.   Review of Systems: Constitutional: +weight loss  Eyes: No changes in vision. ENT: No oral lesions, sore throat.  GI: see HPI.  Heme/Lymph: No easy bruising.  CV: No chest pain.  GU: No hematuria.  Integumentary: No rashes.  Neuro: No headaches.  Psych: No depression/anxiety.  Endocrine: No heat/cold intolerance.  Allergic/Immunologic: No urticaria.  Resp: No cough, SOB.  Musculoskeletal: No joint swelling.    Physical Examination: BP (!) 148/76 (BP Location: Left Arm, Patient Position: Sitting, Cuff Size: Normal)   Pulse 90   Temp (!) 96.8 F (36 C) (Temporal)   Ht 5\' 9"  (1.753 m)   Wt 181 lb (82.1 kg)   BMI  26.73 kg/m  Gen: NAD, alert and oriented x 4 HEENT: [place], EOMI, Neck: supple, no JVD Chest: CTA bilaterally, no wheezes, crackles, or other adventitious sounds CV: RRR, no m/g/c/r Abd: soft, NT, ND, +BS in all four quadrants; no HSM, guarding, ridigity, or rebound tenderness Ext: no edema, well perfused with 2+ pulses, Skin: no rash or lesions noted on observed skin Lymph: no noted LAD  Data Reviewed:  Labs to be scanned into chart from PCP May 2021-CBC normal, CMP with creatinine 1.47, TSH normal   Assessment/Plan: Joseph Wolf is a 74 y.o. male engine bowel habits  1.  Change in bowel habits with mild  constipation-he tried fiber supplement.  He also tried Linzess 145 which caused diarrhea.  He is doing some better with flaxseed.  We discussed trying MiraLAX, we will also send prescription for Linzess 72 mcg which may cause less diarrhea than 162mcg dose.  Routinely not due for colonoscopy but prefers to schedule early given change in bowel habits.  He denies prior issues with sedation.  2.  Gas bloating-relates to first when he is constipated and eats vegetables.  We did also discuss decreasing carbonated sodas too.  He can try Phazyme as needed.  Hopefully will improve with moving stools better.  3. GERD-well controlled on current PPI dose.   Patient denies CP, SOB, and use of blood thinners. I discussed the risks and benefits of procedure including bleeding, perforation, infection, missed lesions, medication reactions and possible hospitalization or surgery if complications. All questions answered.    Joseph Wolf was seen today for narrowing of stool caliber and colonoscopy.  Diagnoses and all orders for this visit:  Change in bowel habits  Other orders -     Discontinue: linaclotide (LINZESS) 72 MCG capsule; Take 1 capsule (72 mcg total) by mouth daily before breakfast. -     linaclotide (LINZESS) 72 MCG capsule; Take 1 capsule (72 mcg total) by mouth daily before breakfast.       I personally performed the service, non-incident to. (WP)  Laurine Blazer, [facility]

## 2019-10-11 NOTE — Telephone Encounter (Signed)
Patient needs Plenvu (copay card) ° °

## 2019-10-11 NOTE — Patient Instructions (Addendum)
We are scheduling colonoscopy for evaluation. Rx for linzess 61mcg sent to pharmacy to try-take 11min before breakfast. Can also try miralax as needed for constipation - this is over the counter. Decrease Mt Dews. Can try phazyme for bloating (over the counter)

## 2019-11-27 ENCOUNTER — Other Ambulatory Visit (HOSPITAL_COMMUNITY)
Admission: RE | Admit: 2019-11-27 | Discharge: 2019-11-27 | Disposition: A | Discharge status: Home / Self Care | Payer: Medicare Other | Source: Ambulatory Visit | Attending: Internal Medicine | Admitting: Internal Medicine

## 2019-11-27 ENCOUNTER — Other Ambulatory Visit: Payer: Self-pay

## 2019-11-27 DIAGNOSIS — Z01812 Encounter for preprocedural laboratory examination: Secondary | ICD-10-CM | POA: Insufficient documentation

## 2019-11-27 DIAGNOSIS — Z20822 Contact with and (suspected) exposure to covid-19: Secondary | ICD-10-CM | POA: Diagnosis not present

## 2019-11-27 LAB — SARS CORONAVIRUS 2 (TAT 6-24 HRS): SARS Coronavirus 2: NEGATIVE

## 2019-11-29 ENCOUNTER — Encounter (HOSPITAL_COMMUNITY): Payer: Self-pay | Admitting: Internal Medicine

## 2019-11-29 ENCOUNTER — Other Ambulatory Visit: Payer: Self-pay

## 2019-11-29 ENCOUNTER — Ambulatory Visit (HOSPITAL_COMMUNITY)
Admission: RE | Admit: 2019-11-29 | Discharge: 2019-11-29 | Disposition: A | Discharge status: Home / Self Care | Payer: Medicare Other | Source: Ambulatory Visit | Attending: Internal Medicine | Admitting: Internal Medicine

## 2019-11-29 ENCOUNTER — Encounter (HOSPITAL_COMMUNITY)
Admission: RE | Disposition: A | Discharge status: Home / Self Care | Payer: Self-pay | Source: Ambulatory Visit | Attending: Internal Medicine

## 2019-11-29 DIAGNOSIS — Z87891 Personal history of nicotine dependence: Secondary | ICD-10-CM | POA: Insufficient documentation

## 2019-11-29 DIAGNOSIS — Z7984 Long term (current) use of oral hypoglycemic drugs: Secondary | ICD-10-CM | POA: Insufficient documentation

## 2019-11-29 DIAGNOSIS — Z881 Allergy status to other antibiotic agents status: Secondary | ICD-10-CM | POA: Diagnosis not present

## 2019-11-29 DIAGNOSIS — K5900 Constipation, unspecified: Secondary | ICD-10-CM | POA: Diagnosis present

## 2019-11-29 DIAGNOSIS — K644 Residual hemorrhoidal skin tags: Secondary | ICD-10-CM | POA: Insufficient documentation

## 2019-11-29 DIAGNOSIS — Z79899 Other long term (current) drug therapy: Secondary | ICD-10-CM | POA: Diagnosis not present

## 2019-11-29 DIAGNOSIS — K6389 Other specified diseases of intestine: Secondary | ICD-10-CM | POA: Diagnosis not present

## 2019-11-29 DIAGNOSIS — R195 Other fecal abnormalities: Secondary | ICD-10-CM | POA: Diagnosis not present

## 2019-11-29 DIAGNOSIS — K648 Other hemorrhoids: Secondary | ICD-10-CM | POA: Insufficient documentation

## 2019-11-29 DIAGNOSIS — R194 Change in bowel habit: Secondary | ICD-10-CM

## 2019-11-29 HISTORY — PX: COLONOSCOPY: SHX5424

## 2019-11-29 HISTORY — DX: Type 2 diabetes mellitus without complications: E11.9

## 2019-11-29 LAB — GLUCOSE, CAPILLARY: Glucose-Capillary: 113 mg/dL — ABNORMAL HIGH (ref 70–99)

## 2019-11-29 SURGERY — COLONOSCOPY
Anesthesia: Moderate Sedation

## 2019-11-29 MED ORDER — MIDAZOLAM HCL 5 MG/5ML IJ SOLN
INTRAMUSCULAR | Status: AC
  Filled 2019-11-29: qty 10

## 2019-11-29 MED ORDER — SODIUM CHLORIDE 0.9 % IV SOLN: INTRAVENOUS | Status: DC

## 2019-11-29 MED ORDER — STERILE WATER FOR IRRIGATION IR SOLN
Status: DC | PRN
  Administered 2019-11-29: 1.5 mL

## 2019-11-29 MED ORDER — MEPERIDINE HCL 50 MG/ML IJ SOLN
INTRAMUSCULAR | Status: DC | PRN
  Administered 2019-11-29 (×2): 25 mg via INTRAVENOUS

## 2019-11-29 MED ORDER — MEPERIDINE HCL 50 MG/ML IJ SOLN
INTRAMUSCULAR | Status: AC
  Filled 2019-11-29: qty 1

## 2019-11-29 MED ORDER — MIDAZOLAM HCL 5 MG/5ML IJ SOLN
INTRAMUSCULAR | Status: DC | PRN
  Administered 2019-11-29 (×3): 2 mg via INTRAVENOUS
  Administered 2019-11-29: 1 mg via INTRAVENOUS

## 2019-11-29 NOTE — Discharge Instructions (Signed)
Resume usual medications as before. Can try Phazyme OTC 1 capsule up to 2 or 3 times a day for bloating as needed.   Modified carb high-fiber diet. No driving for 24 hours.    Hemorrhoids Hemorrhoids are swollen veins that may develop:  In the butt (rectum). These are called internal hemorrhoids.  Around the opening of the butt (anus). These are called external hemorrhoids. Hemorrhoids can cause pain, itching, or bleeding. Most of the time, they do not cause serious problems. They usually get better with diet changes, lifestyle changes, and other home treatments. What are the causes? This condition may be caused by:  Having trouble pooping (constipation).  Pushing hard (straining) to poop.  Watery poop (diarrhea).  Pregnancy.  Being very overweight (obese).  Sitting for long periods of time.  Heavy lifting or other activity that causes you to strain.  Anal sex.  Riding a bike for a long period of time. What are the signs or symptoms? Symptoms of this condition include:  Pain.  Itching or soreness in the butt.  Bleeding from the butt.  Leaking poop.  Swelling in the area.  One or more lumps around the opening of your butt. How is this diagnosed? A doctor can often diagnose this condition by looking at the affected area. The doctor may also:  Do an exam that involves feeling the area with a gloved hand (digital rectal exam).  Examine the area inside your butt using a small tube (anoscope).  Order blood tests. This may be done if you have lost a lot of blood.  Have you get a test that involves looking inside the colon using a flexible tube with a camera on the end (sigmoidoscopy or colonoscopy). How is this treated? This condition can usually be treated at home. Your doctor may tell you to change what you eat, make lifestyle changes, or try home treatments. If these do not help, procedures can be done to remove the hemorrhoids or make them smaller. These may  involve:  Placing rubber bands at the base of the hemorrhoids to cut off their blood supply.  Injecting medicine into the hemorrhoids to shrink them.  Shining a type of light energy onto the hemorrhoids to cause them to fall off.  Doing surgery to remove the hemorrhoids or cut off their blood supply. Follow these instructions at home: Eating and drinking   Eat foods that have a lot of fiber in them. These include whole grains, beans, nuts, fruits, and vegetables.  Ask your doctor about taking products that have added fiber (fibersupplements).  Reduce the amount of fat in your diet. You can do this by: ? Eating low-fat dairy products. ? Eating less red meat. ? Avoiding processed foods.  Drink enough fluid to keep your pee (urine) pale yellow. Managing pain and swelling   Take a warm-water bath (sitz bath) for 20 minutes to ease pain. Do this 3-4 times a day. You may do this in a bathtub or using a portable sitz bath that fits over the toilet.  If told, put ice on the painful area. It may be helpful to use ice between your warm baths. ? Put ice in a plastic bag. ? Place a towel between your skin and the bag. ? Leave the ice on for 20 minutes, 2-3 times a day. General instructions  Take over-the-counter and prescription medicines only as told by your doctor. ? Medicated creams and medicines may be used as told.  Exercise often. Ask your doctor  how much and what kind of exercise is best for you.  Go to the bathroom when you have the urge to poop. Do not wait.  Avoid pushing too hard when you poop.  Keep your butt dry and clean. Use wet toilet paper or moist towelettes after pooping.  Do not sit on the toilet for a long time.  Keep all follow-up visits as told by your doctor. This is important. Contact a doctor if you:  Have pain and swelling that do not get better with treatment or medicine.  Have trouble pooping.  Cannot poop.  Have pain or swelling outside the  area of the hemorrhoids. Get help right away if you have:  Bleeding that will not stop. Summary  Hemorrhoids are swollen veins in the butt or around the opening of the butt.  They can cause pain, itching, or bleeding.  Eat foods that have a lot of fiber in them. These include whole grains, beans, nuts, fruits, and vegetables.  Take a warm-water bath (sitz bath) for 20 minutes to ease pain. Do this 3-4 times a day. This information is not intended to replace advice given to you by your health care provider. Make sure you discuss any questions you have with your health care provider. Document Revised: 01/12/2018 Document Reviewed: 05/26/2017 Elsevier Patient Education  [place].  Colonoscopy, Adult, Care After This sheet gives you information about how to care for yourself after your procedure. Your doctor may also give you more specific instructions. If you have problems or questions, call your doctor. What can I expect after the procedure? After the procedure, it is common to have:  A small amount of blood in your poop (stool) for 24 hours.  Some gas.  Mild cramping or bloating in your belly (abdomen). Follow these instructions at home: Eating and drinking   Drink enough fluid to keep your pee (urine) pale yellow.  Follow instructions from your doctor about what you cannot eat or drink.  Return to your normal diet as told by your doctor. Avoid heavy or fried foods that are hard to digest. Activity  Rest as told by your doctor.  Do not sit for a long time without moving. Get up to take short walks every 1-2 hours. This is important. Ask for help if you feel weak or unsteady.  Return to your normal activities as told by your doctor. Ask your doctor what activities are safe for you. To help cramping and bloating:   Try walking around.  Put heat on your belly as told by your doctor. Use the heat source that your doctor recommends, such as a moist heat pack or a  heating pad. ? Put a towel between your skin and the heat source. ? Leave the heat on for 20-30 minutes. ? Remove the heat if your skin turns bright red. This is very important if you are unable to feel pain, heat, or cold. You may have a greater risk of getting burned. General instructions  For the first 24 hours after the procedure: ? Do not drive or use machinery. ? Do not sign important documents. ? Do not drink alcohol. ? Do your daily activities more slowly than normal. ? Eat foods that are soft and easy to digest.  Take over-the-counter or prescription medicines only as told by your doctor.  Keep all follow-up visits as told by your doctor. This is important. Contact a doctor if:  You have blood in your poop 2-3 days after the  procedure. Get help right away if:  You have more than a small amount of blood in your poop.  You see large clumps of tissue (blood clots) in your poop.  Your belly is swollen.  You feel like you may vomit (nauseous).  You vomit.  You have a fever.  You have belly pain that gets worse, and medicine does not help your pain. Summary  After the procedure, it is common to have a small amount of blood in your poop. You may also have mild cramping and bloating in your belly.  For the first 24 hours after the procedure, do not drive or use machinery, do not sign important documents, and do not drink alcohol.  Get help right away if you have a lot of blood in your poop, feel like you may vomit, have a fever, or have more belly pain. This information is not intended to replace advice given to you by your health care provider. Make sure you discuss any questions you have with your health care provider. Document Revised: 07/31/2018 Document Reviewed: 07/31/2018 Elsevier Patient Education  [place].

## 2019-11-29 NOTE — H&P (Signed)
Joseph Wolf is an 74 y.o. male.   Chief Complaint: Patient is here for colonoscopy. HPI: Patient is 74 year old Caucasian male who has history of colonic polyps with last colonoscopy was in February 2017 who now presents with worsening constipation change in the caliber of stools and bloating and lower abdominal pain.  He has lost 20 pounds.  He is not sure of weight loss was due to shingles.  His appetite is definitely decreased.  No history of melena or rectal bleeding.  Family history is negative for CRC.  He is here for diagnostic colonoscopy.  Past Medical History:  Diagnosis Date  . Diabetes mellitus without complication ([place])   . High cholesterol   . Hypertension   . PONV (postoperative nausea and vomiting)     Past Surgical History:  Procedure Laterality Date  . BACK SURGERY     2014. Has had four back surgeryes total  . COLONOSCOPY N/A 02/26/2015   Procedure: COLONOSCOPY;  Surgeon: Rogene Houston, MD;  Location: [facility];  Service: Endoscopy;  Laterality: N/A;  1:00  . KNEE SURGERY     Left arthro    Family History  Problem Relation Age of Onset  . Ulcerative colitis Paternal Grandmother    Social History:  reports that he has quit smoking. His smoking use included cigarettes. He has never used smokeless tobacco. He reports that he does not drink alcohol and does not use drugs.  Allergies:  Allergies  Allergen Reactions  . Allegra [Fexofenadine] Swelling and Rash  . Keflex [Cephalexin] Swelling and Rash    Medications Prior to Admission  Medication Sig Dispense Refill  . acetaminophen (TYLENOL) 500 MG tablet Take 1,000 mg by mouth every 6 (six) hours as needed for mild pain or moderate pain.     Marland Kitchen ALPRAZolam (XANAX) 0.5 MG tablet Take 0.5 mg by mouth 3 (three) times daily as needed for anxiety.     Marland Kitchen amLODipine (NORVASC) 10 MG tablet Take 10 mg by mouth daily.    Marland Kitchen atenolol (TENORMIN) 100 MG tablet Take 1 tablet (100 mg total) by mouth daily. 30 tablet 0  .  cloNIDine (CATAPRES) 0.2 MG tablet Take 0.2 mg by mouth 2 (two) times daily.    . Flaxseed, Linseed, (FLAX SEED OIL PO) Take 1 tablet by mouth 2 (two) times daily as needed.     . gabapentin (NEURONTIN) 100 MG capsule Take 100 mg by mouth at bedtime.     Marland Kitchen linaclotide (LINZESS) 72 MCG capsule Take 1 capsule (72 mcg total) by mouth daily before breakfast. (Patient taking differently: Take 72 mcg by mouth daily as needed (Constipation). ) 30 capsule 3  . losartan (COZAAR) 100 MG tablet Take 100 mg by mouth daily.    . metFORMIN (GLUCOPHAGE) 850 MG tablet Take 850 mg by mouth 2 (two) times daily with a meal.    . omeprazole (PRILOSEC) 20 MG capsule Take 20 mg by mouth daily.    . potassium chloride SA (KLOR-CON) 20 MEQ tablet Take 20 mEq by mouth daily.     . rosuvastatin (CRESTOR) 20 MG tablet Take 20 mg by mouth at bedtime.     . traZODone (DESYREL) 100 MG tablet Take 100 mg by mouth at bedtime.    . Wheat Dextrin (BENEFIBER DRINK MIX) PACK Take 4 g by mouth at bedtime. (Patient taking differently: Take 4 g by mouth daily as needed (constipation). )      Results for orders placed or performed during the  hospital encounter of 11/29/19 (from the past 48 hour(s))  Glucose, capillary     Status: Abnormal   Collection Time: 11/29/19 11:50 AM  Result Value Ref Range   Glucose-Capillary 113 (H) 70 - 99 mg/dL    Comment: Glucose reference range applies only to samples taken after fasting for at least 8 hours.   No results found.  Review of Systems  Blood pressure (!) 149/92, pulse 80, temperature 98.3 F (36.8 C), temperature source Oral, resp. rate 16, height 5\' 9"  (1.753 m), weight 77.6 kg, SpO2 99 %. Physical Exam HENT:     Mouth/Throat:     Mouth: Mucous membranes are moist.     Pharynx: Oropharynx is clear.  Eyes:     General: No scleral icterus.    Conjunctiva/sclera: Conjunctivae normal.  Neck:     Comments: He has a vertical scar to the left of midline at lower  neck. Cardiovascular:     Rate and Rhythm: Normal rate and regular rhythm.     Heart sounds: Normal heart sounds. No murmur heard.   Pulmonary:     Effort: Pulmonary effort is normal.     Breath sounds: Normal breath sounds.  Abdominal:     Comments: Abdomen symmetrical and soft.  She has mild tenderness across lower abdomen.  No organomegaly or masses.  Musculoskeletal:        General: No swelling.     Cervical back: Neck supple. No rigidity.  Skin:    General: Skin is warm and dry.  Neurological:     Mental Status: He is alert.      Assessment/Plan  Change in bowel habits with worsening constipation and decrease in stool caliber. Diagnostic colonoscopy.  Hildred Laser, MD 11/29/2019, 12:29 PM

## 2019-11-29 NOTE — Op Note (Signed)
[facility] Patient Name: Joseph Wolf Procedure Date: 11/29/2019 12:17 PM MRN: [id number] Date of Birth: 09-08-1945 Attending MD: Hildred Laser , MD CSN: [id number] Age: 74 Admit Type: Outpatient Procedure:                Colonoscopy Indications:              Change in stool caliber, Constipation Providers:                Hildred Laser, MD, Charlsie Quest. Theda Sers RN, RN, Aram Candela Referring MD:             Redmond School, MD Medicines:                Meperidine 50 mg IV, Midazolam 7 mg IV Complications:            No immediate complications. Estimated Blood Loss:     Estimated blood loss: none. Procedure:                Pre-Anesthesia Assessment:                           - Prior to the procedure, a History and Physical                            was performed, and patient medications and                            allergies were reviewed. The patient's tolerance of                            previous anesthesia was also reviewed. The risks                            and benefits of the procedure and the sedation                            options and risks were discussed with the patient.                            All questions were answered, and informed consent                            was obtained. Prior Anticoagulants: The patient has                            taken no previous anticoagulant or antiplatelet                            agents. ASA Grade Assessment: III - A patient with                            severe systemic disease. After reviewing the risks  and benefits, the patient was deemed in                            satisfactory condition to undergo the procedure.                           After obtaining informed consent, the colonoscope                            was passed under direct vision. Throughout the                            procedure, the patient's blood pressure, pulse, and                             oxygen saturations were monitored continuously. The                            [id number]-[id number] ([id number]) scope was introduced through                            the anus and advanced to the the cecum, identified                            by appendiceal orifice and ileocecal valve. The                            colonoscopy was performed without difficulty. The                            patient tolerated the procedure well. The quality                            of the bowel preparation was good. The ileocecal                            valve, appendiceal orifice, and rectum were                            photographed. Scope In: 12:37:52 PM Scope Out: 12:53:58 PM Scope Withdrawal Time: 0 hours 7 minutes 33 seconds  Total Procedure Duration: 0 hours 16 minutes 6 seconds  Findings:      The perianal and digital rectal examinations were normal.      A diffuse area of mild melanosis was found in the entire colon.      External and internal hemorrhoids were found during retroflexion. The       hemorrhoids were medium-sized. Impression:               - Melanosis in the colon.                           - External and internal hemorrhoids.                           -  No specimens collected. Moderate Sedation:      Moderate (conscious) sedation was administered by the endoscopy nurse       and supervised by the endoscopist. The following parameters were       monitored: oxygen saturation, heart rate, blood pressure, CO2       capnography and response to care. Total physician intraservice time was       20 minutes. Recommendation:           - Patient has a contact number available for                            emergencies. The signs and symptoms of potential                            delayed complications were discussed with the                            patient. Return to normal activities tomorrow.                            Written discharge instructions were provided to the                             patient.                           - High fiber diet and diabetic (ADA) diet today.                           - Continue present medications.                           - No repeat colonoscopy due to the absence of                            advanced adenomas. Procedure Code(s):        --- Professional ---                           [phone], Colonoscopy, flexible; diagnostic, including                            collection of specimen(s) by brushing or washing,                            when performed (separate procedure)                           G0500, Moderate sedation services provided by the                            same physician or other qualified health care                            professional performing a gastrointestinal  endoscopic service that sedation supports,                            requiring the presence of an independent trained                            observer to assist in the monitoring of the                            patient's level of consciousness and physiological                            status; initial 15 minutes of intra-service time;                            patient age 62 years or older (additional time may                            be reported with [phone], as appropriate) Diagnosis Code(s):        --- Professional ---                           K63.89, Other specified diseases of intestine                           K64.8, Other hemorrhoids                           R19.5, Other fecal abnormalities                           K59.00, Constipation, unspecified CPT copyright 2019 American [employer]. All rights reserved. The codes documented in this report are preliminary and upon coder review may  be revised to meet current compliance requirements. Hildred Laser, MD Hildred Laser, MD 11/29/2019 1:03:27 PM This report has been signed electronically. Number of Addenda: 0

## 2019-12-05 ENCOUNTER — Encounter (HOSPITAL_COMMUNITY): Payer: Self-pay | Admitting: Internal Medicine

## 2020-10-06 ENCOUNTER — Other Ambulatory Visit: Payer: Self-pay | Admitting: Internal Medicine

## 2020-10-06 ENCOUNTER — Other Ambulatory Visit (HOSPITAL_COMMUNITY): Payer: Self-pay | Admitting: Internal Medicine

## 2020-10-06 DIAGNOSIS — R109 Unspecified abdominal pain: Secondary | ICD-10-CM

## 2020-10-29 ENCOUNTER — Encounter (INDEPENDENT_AMBULATORY_CARE_PROVIDER_SITE_OTHER): Payer: Self-pay | Admitting: *Deleted

## 2020-10-30 ENCOUNTER — Other Ambulatory Visit: Payer: Self-pay

## 2020-10-30 ENCOUNTER — Ambulatory Visit (HOSPITAL_COMMUNITY)
Admission: RE | Admit: 2020-10-30 | Discharge: 2020-10-30 | Disposition: A | Discharge status: Home / Self Care | Payer: Medicare Other | Source: Ambulatory Visit | Attending: Internal Medicine | Admitting: Internal Medicine

## 2020-10-30 DIAGNOSIS — R109 Unspecified abdominal pain: Secondary | ICD-10-CM | POA: Insufficient documentation

## 2020-10-30 MED ORDER — IOHEXOL 300 MG/ML  SOLN
100.0000 mL | Freq: Once | INTRAMUSCULAR | Status: AC | PRN
  Administered 2020-10-30: 100 mL via INTRAVENOUS

## 2020-10-31 LAB — POCT I-STAT CREATININE: Creatinine, Ser: 1.2 mg/dL (ref 0.61–1.24)

## 2020-12-01 ENCOUNTER — Other Ambulatory Visit (INDEPENDENT_AMBULATORY_CARE_PROVIDER_SITE_OTHER): Payer: Self-pay

## 2020-12-01 ENCOUNTER — Encounter (INDEPENDENT_AMBULATORY_CARE_PROVIDER_SITE_OTHER): Payer: Self-pay | Admitting: Gastroenterology

## 2020-12-01 ENCOUNTER — Encounter (INDEPENDENT_AMBULATORY_CARE_PROVIDER_SITE_OTHER): Payer: Self-pay

## 2020-12-01 ENCOUNTER — Ambulatory Visit (INDEPENDENT_AMBULATORY_CARE_PROVIDER_SITE_OTHER): Payer: Medicare Other | Admitting: Gastroenterology

## 2020-12-01 ENCOUNTER — Other Ambulatory Visit: Payer: Self-pay

## 2020-12-01 VITALS — BP 174/93 | HR 92 | Temp 97.8°F | Ht 69.0 in | Wt 193.0 lb

## 2020-12-01 DIAGNOSIS — K219 Gastro-esophageal reflux disease without esophagitis: Secondary | ICD-10-CM | POA: Diagnosis not present

## 2020-12-01 DIAGNOSIS — R140 Abdominal distension (gaseous): Secondary | ICD-10-CM

## 2020-12-01 DIAGNOSIS — R1013 Epigastric pain: Secondary | ICD-10-CM

## 2020-12-01 DIAGNOSIS — K5900 Constipation, unspecified: Secondary | ICD-10-CM | POA: Diagnosis not present

## 2020-12-01 NOTE — Patient Instructions (Signed)
Please increase your omeprazole 20mg  to twice a day, take one in the morning 30-45 minutes before breakfast and take the second one 30-45 minutes prior to dinner in the evenings.  increase miralax to 1 capful every 12 hours. If after two weeks there is no improvement, increase to 1 capful every 8 hours. Please continue to drink plenty of water, eat a diet high in fruits and vegetables, especially kiwi and prunes as these are good for constipation.  You can try taking over the counter phazyme for your bloating/gas.  We will get you scheduled for EGD for further evaluation of bloating and upper abdominal pain.  Follow up 6 months

## 2020-12-01 NOTE — H&P (View-Only) (Signed)
Referring Provider: Redmond School, MD Primary Care Physician:  Redmond School, MD Primary GI Physician: Rehman  Chief Complaint  Patient presents with   Nausea    Having some nausea and abdominal pain in epigastric area, some burning in throat.    HPI:   Joseph Wolf is a 75 y.o. male with past medical history of DM, high cholesterol, HTN, GERD.  Patient presenting today for epigastric pain, bloating, GERD and constipation.  He was last seen in sept 2021 with c/o changes in bowel habits (constipation) and smaller caliber stools, he also endorsed some abdominal pain and bloating, worse with eating vegetables. He underwent diagnostic colonoscopy in nov 2021 without significant findings. He was on linzess 51mcg in the past which caused diarrhea, however this was restarted, he continued to have diarrhea from it so PCP advised him to start miralax one capful/day. Patient reports that symptoms feel similar to the ones he was seen for last year prior to colonoscopy. He is currently only having a BM 2-3x per week. He denies any straining when having a BM. Has not tried increasing miralax.  He endorses bloating to his upper abdomen/epigastric area. He is currently taking Omeprazole 20mg  daily for GERD. He is having 3-4 episodes of acid reflux per week. He reports that abdominal discomfort seems to improve after a BM. He denies vomiting, but sometimes has nausea. He denies dysphagia or odynophagia. He denies postprandial abdominal pain. He is passing flatus which helps improve his abdominal pain some. He will occasionally have some back pain that feels like trapped gas. He does not notice worsening back pain at times of acid reflux/regurgitation.  Denies blood in stools, melena, no changes in appetite or early satiety. 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 - External and internal  hemorrhoids. - No specimens collected. Last Endoscopy:n/a   Past Medical History:  Diagnosis Date   Diabetes mellitus without complication ([place])    High cholesterol    Hypertension    PONV (postoperative nausea and vomiting)     Past Surgical History:  Procedure Laterality Date   BACK SURGERY     2014. Has had four back surgeryes total   COLONOSCOPY N/A 02/26/2015   Procedure: COLONOSCOPY;  Surgeon: Rogene Houston, MD;  [facility]: [facility];  Service: Endoscopy;  Laterality: N/A;  1:00   COLONOSCOPY N/A 11/29/2019   Procedure: COLONOSCOPY;  Surgeon: Rogene Houston, MD;  Location: [facility];  Service: Endoscopy;  Laterality: N/A;  155   KNEE SURGERY     Left arthro    Current Outpatient Medications  Medication Sig Dispense Refill   acetaminophen (TYLENOL) 500 MG tablet Take 1,000 mg by mouth every 6 (six) hours as needed for mild pain or moderate pain.      ALPRAZolam (XANAX) 0.5 MG tablet Take 0.5 mg by mouth 3 (three) times daily as needed for anxiety.      amLODipine (NORVASC) 10 MG tablet Take 10 mg by mouth daily.     atenolol (TENORMIN) 100 MG tablet Take 1 tablet (100 mg total) by mouth daily. 30 tablet 0   cloNIDine (CATAPRES) 0.2 MG tablet Take 0.2 mg by mouth 2 (two) times daily.     gabapentin (NEURONTIN) 100 MG capsule Take 100 mg by mouth at bedtime.      Levothyroxine Sodium 25 MCG CAPS Take by mouth daily before breakfast.  linaclotide (LINZESS) 72 MCG capsule Take 1 capsule (72 mcg total) by mouth daily before breakfast. (Patient taking differently: Take 72 mcg by mouth daily as needed (Constipation).) 30 capsule 3   losartan (COZAAR) 100 MG tablet Take 100 mg by mouth daily.     metFORMIN (GLUCOPHAGE) 850 MG tablet Take 850 mg by mouth 2 (two) times daily with a meal.     omeprazole (PRILOSEC) 20 MG capsule Take 20 mg by mouth daily.     polyethylene glycol (MIRALAX / GLYCOLAX) 17 g packet Take 17 g by mouth daily. 1 capful daily     potassium chloride  SA (KLOR-CON) 20 MEQ tablet Take 20 mEq by mouth daily.      rosuvastatin (CRESTOR) 20 MG tablet Take 20 mg by mouth at bedtime.      traZODone (DESYREL) 100 MG tablet Take 100 mg by mouth at bedtime.     No current facility-administered medications for this visit.    Allergies as of 12/01/2020 - Review Complete 12/01/2020  Allergen Reaction Noted   Allegra [fexofenadine] Swelling and Rash 01/22/2015   Keflex [cephalexin] Swelling and Rash 01/22/2015    Family History  Problem Relation Age of Onset   Ulcerative colitis Paternal Grandmother     Social History   Socioeconomic History   Marital status: Married    Spouse name: Not on file   Number of children: Not on file   Years of education: Not on file   Highest education level: Not on file  Occupational History   Not on file  Tobacco Use   Smoking status: Former    Types: Cigarettes   Smokeless tobacco: Never   Tobacco comments:    quit in 1990  Substance and Sexual Activity   Alcohol use: No    Alcohol/week: 0.0 standard drinks   Drug use: No   Sexual activity: Not on file  Other Topics Concern   Not on file  Social History Narrative   Not on file   Social Determinants of Health   Financial Resource Strain: Not on file  Food Insecurity: Not on file  Transportation Needs: Not on file  Physical Activity: Not on file  Stress: Not on file  Social Connections: Not on file   Review of systems General: negative for malaise, night sweats, fever, chills, weight loss Neck: Negative for lumps, goiter, pain and significant neck swelling Resp: Negative for cough, wheezing, dyspnea at rest CV: Negative for chest pain, leg swelling, palpitations, orthopnea GI: denies melena, hematochezia, nausea, vomiting, diarrhea, dysphagia, odyonophagia, early satiety or unintentional weight loss. +constipation +epigastric pain +bloating +reflux MSK: Negative for joint pain or swelling, back pain, and muscle pain. Derm: Negative for  itching or rash Psych: Denies depression, anxiety, memory loss, confusion. No homicidal or suicidal ideation.  Heme: Negative for prolonged bleeding, bruising easily, and swollen nodes. Endocrine: Negative for cold or heat intolerance, polyuria, polydipsia and goiter. Neuro: negative for tremor, gait imbalance, syncope and seizures. The remainder of the review of systems is noncontributory.  Physical Exam: BP (!) 174/93 (BP Location: Right Arm, Patient Position: Sitting, Cuff Size: Large)   Pulse 92   Temp 97.8 F (36.6 C) (Oral)   Ht 5\' 9"  (1.753 m)   Wt 193 lb (87.5 kg)   BMI 28.50 kg/m  General:   Alert and oriented. No distress noted. Pleasant and cooperative.  Head:  Normocephalic and atraumatic. Eyes:  Conjuctiva clear without scleral icterus. Mouth:  Oral mucosa pink and moist. Good dentition.  No lesions. Heart: Normal rate and rhythm, s1 and s2 heart sounds present.  Lungs: Clear lung sounds in all lobes. Respirations equal and unlabored. Abdomen:  +BS, soft, non-tender and non-distended. No rebound or guarding. No HSM or masses noted. Derm: No palmar erythema or jaundice Msk:  Symmetrical without gross deformities. Normal posture. Extremities:  Without edema. Neurologic:  Alert and  oriented x4 Psych:  Alert and cooperative. Normal mood and affect.  Invalid input(s): 6 MONTHS   ASSESSMENT: Joseph Wolf is a 75 y.o. male presenting today for epigastric/upper abdominal pain, constipation, GERD and bloating.   Currently on miralax one capful/day, having a BM every 2-3 days, he denies straining but reports epigastric/upper abdominal pain/discomfort as well as bloating and passing gas. Abdominal discomfort sometimes improves with BM or passing gas and is not worsened postprandial. He thinks bloating could be worsened by some foods he is eating. He has been on Linzess 58mcg in the past but felt that it caused him to have too many episodes of diarrhea. He has not tried increased  his dose. We will step up to two capfuls of miralax per day, he can increase to three capfuls per day if no improvement in frequency of BMs after 2 weeks.   GERD is not well managed on omeprazole 20mg  once daily, having 3-4 episodes of heartburn/acid regurgitation per week, without dysphagia or odynophagia. Will increase PPI to BID.   Patient's symptoms are similar to those he presented to clinic with last year prior to colonoscopy. Colonoscopy was unrevealing, I suspect a lot of his symptoms are related to his poorly managed constipation and/or GERD, however, given his ongoing bloating/epigastric discomfort, we will proceed with EGD to r/o gastritis, duodenitis, PUD, or H. Pylori. Given lack of red flag symptoms, malignancy would be very unlikely.    PLAN:  Increase to one capful miralax q12 hrs x2 weeks, increase to one capful q8hrs if no improvement after 2 weeks 2. Increase omeprazole 20mg  to BID ( will get through the [place]) 3. EGD for further evaluation of epigastric pain/bloating 4. Can use over the counter phazyme for bloating/gas 5. Continue to drink plenty of fluid, eat diet high in fruits/veggies/kiwi/prunes  Follow Up: 6 months   Anaysha Andre L. Alver Sorrow, MSN, APRN, AGNP-C Adult-Gerontology Nurse Practitioner [facility]

## 2020-12-01 NOTE — Progress Notes (Signed)
Referring Provider: Redmond School, MD Primary Care Physician:  Redmond School, MD Primary GI Physician: Rehman  Chief Complaint  Patient presents with   Nausea    Having some nausea and abdominal pain in epigastric area, some burning in throat.    HPI:   Joseph Wolf is a 75 y.o. male with past medical history of DM, high cholesterol, HTN, GERD.  Patient presenting today for epigastric pain, bloating, GERD and constipation.  He was last seen in sept 2021 with c/o changes in bowel habits (constipation) and smaller caliber stools, he also endorsed some abdominal pain and bloating, worse with eating vegetables. He underwent diagnostic colonoscopy in nov 2021 without significant findings. He was on linzess 51mcg in the past which caused diarrhea, however this was restarted, he continued to have diarrhea from it so PCP advised him to start miralax one capful/day. Patient reports that symptoms feel similar to the ones he was seen for last year prior to colonoscopy. He is currently only having a BM 2-3x per week. He denies any straining when having a BM. Has not tried increasing miralax.  He endorses bloating to his upper abdomen/epigastric area. He is currently taking Omeprazole 20mg  daily for GERD. He is having 3-4 episodes of acid reflux per week. He reports that abdominal discomfort seems to improve after a BM. He denies vomiting, but sometimes has nausea. He denies dysphagia or odynophagia. He denies postprandial abdominal pain. He is passing flatus which helps improve his abdominal pain some. He will occasionally have some back pain that feels like trapped gas. He does not notice worsening back pain at times of acid reflux/regurgitation.  Denies blood in stools, melena, no changes in appetite or early satiety. 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 - External and internal  hemorrhoids. - No specimens collected. Last Endoscopy:n/a   Past Medical History:  Diagnosis Date   Diabetes mellitus without complication ([place])    High cholesterol    Hypertension    PONV (postoperative nausea and vomiting)     Past Surgical History:  Procedure Laterality Date   BACK SURGERY     2014. Has had four back surgeryes total   COLONOSCOPY N/A 02/26/2015   Procedure: COLONOSCOPY;  Surgeon: Rogene Houston, MD;  [facility];  Service: Endoscopy;  Laterality: N/A;  1:00   COLONOSCOPY N/A 11/29/2019   Procedure: COLONOSCOPY;  Surgeon: Rogene Houston, MD;  [facility];  Service: Endoscopy;  Laterality: N/A;  155   KNEE SURGERY     Left arthro    Current Outpatient Medications  Medication Sig Dispense Refill   acetaminophen (TYLENOL) 500 MG tablet Take 1,000 mg by mouth every 6 (six) hours as needed for mild pain or moderate pain.      ALPRAZolam (XANAX) 0.5 MG tablet Take 0.5 mg by mouth 3 (three) times daily as needed for anxiety.      amLODipine (NORVASC) 10 MG tablet Take 10 mg by mouth daily.     atenolol (TENORMIN) 100 MG tablet Take 1 tablet (100 mg total) by mouth daily. 30 tablet 0   cloNIDine (CATAPRES) 0.2 MG tablet Take 0.2 mg by mouth 2 (two) times daily.     gabapentin (NEURONTIN) 100 MG capsule Take 100 mg by mouth at bedtime.      Levothyroxine Sodium 25 MCG CAPS Take by mouth daily before breakfast.  linaclotide (LINZESS) 72 MCG capsule Take 1 capsule (72 mcg total) by mouth daily before breakfast. (Patient taking differently: Take 72 mcg by mouth daily as needed (Constipation).) 30 capsule 3   losartan (COZAAR) 100 MG tablet Take 100 mg by mouth daily.     metFORMIN (GLUCOPHAGE) 850 MG tablet Take 850 mg by mouth 2 (two) times daily with a meal.     omeprazole (PRILOSEC) 20 MG capsule Take 20 mg by mouth daily.     polyethylene glycol (MIRALAX / GLYCOLAX) 17 g packet Take 17 g by mouth daily. 1 capful daily     potassium chloride  SA (KLOR-CON) 20 MEQ tablet Take 20 mEq by mouth daily.      rosuvastatin (CRESTOR) 20 MG tablet Take 20 mg by mouth at bedtime.      traZODone (DESYREL) 100 MG tablet Take 100 mg by mouth at bedtime.     No current facility-administered medications for this visit.    Allergies as of 12/01/2020 - Review Complete 12/01/2020  Allergen Reaction Noted   Allegra [fexofenadine] Swelling and Rash 01/22/2015   Keflex [cephalexin] Swelling and Rash 01/22/2015    Family History  Problem Relation Age of Onset   Ulcerative colitis Paternal Grandmother     Social History   Socioeconomic History   Marital status: Married    Spouse name: Not on file   Number of children: Not on file   Years of education: Not on file   Highest education level: Not on file  Occupational History   Not on file  Tobacco Use   Smoking status: Former    Types: Cigarettes   Smokeless tobacco: Never   Tobacco comments:    quit in 1990  Substance and Sexual Activity   Alcohol use: No    Alcohol/week: 0.0 standard drinks   Drug use: No   Sexual activity: Not on file  Other Topics Concern   Not on file  Social History Narrative   Not on file   Social Determinants of Health   Financial Resource Strain: Not on file  Food Insecurity: Not on file  Transportation Needs: Not on file  Physical Activity: Not on file  Stress: Not on file  Social Connections: Not on file   Review of systems General: negative for malaise, night sweats, fever, chills, weight loss Neck: Negative for lumps, goiter, pain and significant neck swelling Resp: Negative for cough, wheezing, dyspnea at rest CV: Negative for chest pain, leg swelling, palpitations, orthopnea GI: denies melena, hematochezia, nausea, vomiting, diarrhea, dysphagia, odyonophagia, early satiety or unintentional weight loss. +constipation +epigastric pain +bloating +reflux MSK: Negative for joint pain or swelling, back pain, and muscle pain. Derm: Negative for  itching or rash Psych: Denies depression, anxiety, memory loss, confusion. No homicidal or suicidal ideation.  Heme: Negative for prolonged bleeding, bruising easily, and swollen nodes. Endocrine: Negative for cold or heat intolerance, polyuria, polydipsia and goiter. Neuro: negative for tremor, gait imbalance, syncope and seizures. The remainder of the review of systems is noncontributory.  Physical Exam: BP (!) 174/93 (BP Location: Right Arm, Patient Position: Sitting, Cuff Size: Large)   Pulse 92   Temp 97.8 F (36.6 C) (Oral)   Ht 5\' 9"  (1.753 m)   Wt 193 lb (87.5 kg)   BMI 28.50 kg/m  General:   Alert and oriented. No distress noted. Pleasant and cooperative.  Head:  Normocephalic and atraumatic. Eyes:  Conjuctiva clear without scleral icterus. Mouth:  Oral mucosa pink and moist. Good dentition.  No lesions. Heart: Normal rate and rhythm, s1 and s2 heart sounds present.  Lungs: Clear lung sounds in all lobes. Respirations equal and unlabored. Abdomen:  +BS, soft, non-tender and non-distended. No rebound or guarding. No HSM or masses noted. Derm: No palmar erythema or jaundice Msk:  Symmetrical without gross deformities. Normal posture. Extremities:  Without edema. Neurologic:  Alert and  oriented x4 Psych:  Alert and cooperative. Normal mood and affect.  Invalid input(s): 6 MONTHS   ASSESSMENT: Joseph Wolf is a 75 y.o. male presenting today for epigastric/upper abdominal pain, constipation, GERD and bloating.   Currently on miralax one capful/day, having a BM every 2-3 days, he denies straining but reports epigastric/upper abdominal pain/discomfort as well as bloating and passing gas. Abdominal discomfort sometimes improves with BM or passing gas and is not worsened postprandial. He thinks bloating could be worsened by some foods he is eating. He has been on Linzess 58mcg in the past but felt that it caused him to have too many episodes of diarrhea. He has not tried increased  his dose. We will step up to two capfuls of miralax per day, he can increase to three capfuls per day if no improvement in frequency of BMs after 2 weeks.   GERD is not well managed on omeprazole 20mg  once daily, having 3-4 episodes of heartburn/acid regurgitation per week, without dysphagia or odynophagia. Will increase PPI to BID.   Patient's symptoms are similar to those he presented to clinic with last year prior to colonoscopy. Colonoscopy was unrevealing, I suspect a lot of his symptoms are related to his poorly managed constipation and/or GERD, however, given his ongoing bloating/epigastric discomfort, we will proceed with EGD to r/o gastritis, duodenitis, PUD, or H. Pylori. Given lack of red flag symptoms, malignancy would be very unlikely.    PLAN:  Increase to one capful miralax q12 hrs x2 weeks, increase to one capful q8hrs if no improvement after 2 weeks 2. Increase omeprazole 20mg  to BID ( will get through the [place]) 3. EGD for further evaluation of epigastric pain/bloating 4. Can use over the counter phazyme for bloating/gas 5. Continue to drink plenty of fluid, eat diet high in fruits/veggies/kiwi/prunes  Follow Up: 6 months   Javel Hersh L. Alver Sorrow, MSN, APRN, AGNP-C Adult-Gerontology Nurse Practitioner [facility]

## 2020-12-02 ENCOUNTER — Encounter (INDEPENDENT_AMBULATORY_CARE_PROVIDER_SITE_OTHER): Payer: Self-pay

## 2020-12-18 ENCOUNTER — Encounter (HOSPITAL_COMMUNITY)
Admission: RE | Admit: 2020-12-18 | Discharge: 2020-12-18 | Disposition: A | Discharge status: Home / Self Care | Payer: Medicare Other | Source: Ambulatory Visit | Attending: Gastroenterology | Admitting: Gastroenterology

## 2020-12-18 ENCOUNTER — Other Ambulatory Visit: Payer: Self-pay

## 2020-12-18 ENCOUNTER — Encounter (HOSPITAL_COMMUNITY): Payer: Self-pay

## 2020-12-18 VITALS — BP 145/78 | HR 90 | Temp 98.3°F | Resp 18 | Ht 69.0 in | Wt 192.0 lb

## 2020-12-18 DIAGNOSIS — Z01818 Encounter for other preprocedural examination: Secondary | ICD-10-CM | POA: Insufficient documentation

## 2020-12-18 DIAGNOSIS — E119 Type 2 diabetes mellitus without complications: Secondary | ICD-10-CM | POA: Diagnosis not present

## 2020-12-18 LAB — BASIC METABOLIC PANEL
Anion gap: 9 (ref 5–15)
BUN: 18 mg/dL (ref 8–23)
CO2: 25 mmol/L (ref 22–32)
Calcium: 9.6 mg/dL (ref 8.9–10.3)
Chloride: 104 mmol/L (ref 98–111)
Creatinine, Ser: 1.25 mg/dL — ABNORMAL HIGH (ref 0.61–1.24)
GFR, Estimated: >60 mL/min (ref >60)
Glucose, Bld: 109 mg/dL — ABNORMAL HIGH (ref 70–99)
Potassium: 4 mmol/L (ref 3.5–5.1)
Sodium: 138 mmol/L (ref 135–145)

## 2020-12-18 NOTE — Patient Instructions (Signed)
Joseph Wolf  12/18/2020     @PREFPERIOPPHARMACY @   Your procedure is scheduled on  12/23/2020.   Report to [facility] at  0700 A.M.   Call this number if you have problems the morning of surgery:  [phone]   Remember:  Follow the diet and prep instructions givrn to you by the office.    Take these medicines the morning of surgery with A SIP OF WATER           xanax(if needed), amlodipine, atenolol, clonidine, gabapentin, levothyroxine.    Do not wear jewelry, make-up or nail polish.  Do not wear lotions, powders, or perfumes, or deodorant.  Do not shave 48 hours prior to surgery.  Men may shave face and neck.  Do not bring valuables to the hospital.  [facility] is not responsible for any belongings or valuables.  Contacts, dentures or bridgework may not be worn into surgery.  Leave your suitcase in the car.  After surgery it may be brought to your room.  For patients admitted to the hospital, discharge time will be determined by your treatment team.  Patients discharged the day of surgery will not be allowed to drive home and must have someone with them for 24 hours.    Special instructions:   DO NOT smoke tobacco or vape for 24 hours before your procedure.  Please read over the following fact sheets that you were given. Anesthesia Post-op Instructions and Care and Recovery After Surgery      Upper Endoscopy, Adult, Care After This sheet gives you information about how to care for yourself after your procedure. Your health care provider may also give you more specific instructions. If you have problems or questions, contact your health care provider. What can I expect after the procedure? After the procedure, it is common to have: A sore throat. Mild stomach pain or discomfort. Bloating. Nausea. Follow these instructions at home:  Follow instructions from your health care provider about what to eat or drink after your procedure. Return to your normal  activities as told by your health care provider. Ask your health care provider what activities are safe for you. Take over-the-counter and prescription medicines only as told by your health care provider. If you were given a sedative during the procedure, it can affect you for several hours. Do not drive or operate machinery until your health care provider says that it is safe. Keep all follow-up visits as told by your health care provider. This is important. Contact a health care provider if you have: A sore throat that lasts longer than one day. Trouble swallowing. Get help right away if: You vomit blood or your vomit looks like coffee grounds. You have: A fever. Bloody, black, or tarry stools. A severe sore throat or you cannot swallow. Difficulty breathing. Severe pain in your chest or abdomen. Summary After the procedure, it is common to have a sore throat, mild stomach discomfort, bloating, and nausea. If you were given a sedative during the procedure, it can affect you for several hours. Do not drive or operate machinery until your health care provider says that it is safe. Follow instructions from your health care provider about what to eat or drink after your procedure. Return to your normal activities as told by your health care provider. This information is not intended to replace advice given to you by your health care provider. Make sure you discuss any questions you have with your  health care provider. Document Revised: 11/10/2018 Document Reviewed: 06/06/2017 Elsevier Patient Education  2022 [employer] After This sheet gives you information about how to care for yourself after your procedure. Your health care provider may also give you more specific instructions. If you have problems or questions, contact your health care provider. What can I expect after the procedure? After the procedure, it is common to have: Tiredness. Forgetfulness  about what happened after the procedure. Impaired judgment for important decisions. Nausea or vomiting. Some difficulty with balance. Follow these instructions at home: For the time period you were told by your health care provider:   Rest as needed. Do not participate in activities where you could fall or become injured. Do not drive or use machinery. Do not drink alcohol. Do not take sleeping pills or medicines that cause drowsiness. Do not make important decisions or sign legal documents. Do not take care of children on your own. Eating and drinking Follow the diet that is recommended by your health care provider. Drink enough fluid to keep your urine pale yellow. If you vomit: Drink water, juice, or soup when you can drink without vomiting. Make sure you have little or no nausea before eating solid foods. General instructions Have a responsible adult stay with you for the time you are told. It is important to have someone help care for you until you are awake and alert. Take over-the-counter and prescription medicines only as told by your health care provider. If you have sleep apnea, surgery and certain medicines can increase your risk for breathing problems. Follow instructions from your health care provider about wearing your sleep device: Anytime you are sleeping, including during daytime naps. While taking prescription pain medicines, sleeping medicines, or medicines that make you drowsy. Avoid smoking. Keep all follow-up visits as told by your health care provider. This is important. Contact a health care provider if: You keep feeling nauseous or you keep vomiting. You feel light-headed. You are still sleepy or having trouble with balance after 24 hours. You develop a rash. You have a fever. You have redness or swelling around the IV site. Get help right away if: You have trouble breathing. You have new-onset confusion at home. Summary For several hours after your  procedure, you may feel tired. You may also be forgetful and have poor judgment. Have a responsible adult stay with you for the time you are told. It is important to have someone help care for you until you are awake and alert. Rest as told. Do not drive or operate machinery. Do not drink alcohol or take sleeping pills. Get help right away if you have trouble breathing, or if you suddenly become confused. This information is not intended to replace advice given to you by your health care provider. Make sure you discuss any questions you have with your health care provider. Document Revised: 09/20/2019 Document Reviewed: 12/07/2018 Elsevier Patient Education  2022 [employer].

## 2020-12-23 ENCOUNTER — Encounter (HOSPITAL_COMMUNITY)
Admission: RE | Disposition: A | Discharge status: Home / Self Care | Payer: Self-pay | Source: Home / Self Care | Attending: Gastroenterology

## 2020-12-23 ENCOUNTER — Ambulatory Visit (HOSPITAL_COMMUNITY)
Admission: RE | Admit: 2020-12-23 | Discharge: 2020-12-23 | Disposition: A | Discharge status: Home / Self Care | Payer: Medicare Other | Attending: Gastroenterology | Admitting: Gastroenterology

## 2020-12-23 ENCOUNTER — Encounter (HOSPITAL_COMMUNITY): Payer: Self-pay | Admitting: Gastroenterology

## 2020-12-23 ENCOUNTER — Ambulatory Visit (HOSPITAL_COMMUNITY): Payer: Medicare Other | Admitting: Anesthesiology

## 2020-12-23 ENCOUNTER — Other Ambulatory Visit: Payer: Self-pay

## 2020-12-23 DIAGNOSIS — K5900 Constipation, unspecified: Secondary | ICD-10-CM | POA: Insufficient documentation

## 2020-12-23 DIAGNOSIS — K3189 Other diseases of stomach and duodenum: Secondary | ICD-10-CM

## 2020-12-23 DIAGNOSIS — R1013 Epigastric pain: Secondary | ICD-10-CM | POA: Diagnosis present

## 2020-12-23 DIAGNOSIS — K219 Gastro-esophageal reflux disease without esophagitis: Secondary | ICD-10-CM | POA: Insufficient documentation

## 2020-12-23 DIAGNOSIS — I10 Essential (primary) hypertension: Secondary | ICD-10-CM | POA: Diagnosis not present

## 2020-12-23 DIAGNOSIS — E119 Type 2 diabetes mellitus without complications: Secondary | ICD-10-CM | POA: Insufficient documentation

## 2020-12-23 DIAGNOSIS — Z7984 Long term (current) use of oral hypoglycemic drugs: Secondary | ICD-10-CM | POA: Diagnosis not present

## 2020-12-23 DIAGNOSIS — K449 Diaphragmatic hernia without obstruction or gangrene: Secondary | ICD-10-CM | POA: Insufficient documentation

## 2020-12-23 DIAGNOSIS — Z87891 Personal history of nicotine dependence: Secondary | ICD-10-CM | POA: Diagnosis not present

## 2020-12-23 DIAGNOSIS — K31A19 Gastric intestinal metaplasia without dysplasia, unspecified site: Secondary | ICD-10-CM | POA: Diagnosis not present

## 2020-12-23 DIAGNOSIS — R140 Abdominal distension (gaseous): Secondary | ICD-10-CM

## 2020-12-23 DIAGNOSIS — K317 Polyp of stomach and duodenum: Secondary | ICD-10-CM | POA: Diagnosis not present

## 2020-12-23 DIAGNOSIS — E7800 Pure hypercholesterolemia, unspecified: Secondary | ICD-10-CM | POA: Insufficient documentation

## 2020-12-23 HISTORY — PX: BIOPSY: SHX5522

## 2020-12-23 HISTORY — PX: ESOPHAGOGASTRODUODENOSCOPY (EGD) WITH PROPOFOL: SHX5813

## 2020-12-23 LAB — GLUCOSE, CAPILLARY: Glucose-Capillary: 154 mg/dL — ABNORMAL HIGH (ref 70–99)

## 2020-12-23 SURGERY — ESOPHAGOGASTRODUODENOSCOPY (EGD) WITH PROPOFOL
Anesthesia: General

## 2020-12-23 MED ORDER — PROPOFOL 500 MG/50ML IV EMUL
INTRAVENOUS | Status: DC | PRN
  Administered 2020-12-23: 150 ug/kg/min via INTRAVENOUS

## 2020-12-23 MED ORDER — LACTATED RINGERS IV SOLN: INTRAVENOUS | Status: DC

## 2020-12-23 NOTE — Interval H&P Note (Signed)
History and Physical Interval Note:  12/23/2020 7:42 AM  [name]  has presented today for surgery, with the diagnosis of Epigastric Pain Bloating.  The various methods of treatment have been discussed with the patient and family. After consideration of risks, benefits and other options for treatment, the patient has consented to  Procedure(s) with comments: ESOPHAGOGASTRODUODENOSCOPY (EGD) WITH PROPOFOL (N/A) - 8:15 as a surgical intervention.  The patient's history has been reviewed, patient examined, no change in status, stable for surgery.  I have reviewed the patient's chart and labs.  Questions were answered to the patient's satisfaction.     Joseph Wolf

## 2020-12-23 NOTE — Anesthesia Postprocedure Evaluation (Signed)
Anesthesia Post Note  Patient: Joseph Wolf  Procedure(s) Performed: ESOPHAGOGASTRODUODENOSCOPY (EGD) WITH PROPOFOL BIOPSY  Patient location during evaluation: Endoscopy Anesthesia Type: General Level of consciousness: awake and alert and oriented Pain management: pain level controlled Vital Signs Assessment: post-procedure vital signs reviewed and stable Respiratory status: spontaneous breathing, nonlabored ventilation and respiratory function stable Cardiovascular status: blood pressure returned to baseline and stable Postop Assessment: no apparent nausea or vomiting Anesthetic complications: no   No notable events documented.   Last Vitals:  Vitals:   12/23/20 0759 12/23/20 0903  BP: (!) 148/87 112/66  Pulse: 66 (!) 59  Resp:  (!) 59  Temp: 36.7 C 36.7 C  SpO2: 99% 98%    Last Pain:  Vitals:   12/23/20 0903  TempSrc: Axillary  PainSc: 0-No pain                 Timaya Bojarski C David Towson

## 2020-12-23 NOTE — Discharge Instructions (Signed)
You are being discharged to home.  Resume your previous diet.  We are waiting for your pathology results. Continue Miralax daily and omeprazole 20 mg twice a day. Check H. pylori serology.

## 2020-12-23 NOTE — Transfer of Care (Signed)
Immediate Anesthesia Transfer of Care Note  Patient: Joseph Wolf  Procedure(s) Performed: ESOPHAGOGASTRODUODENOSCOPY (EGD) WITH PROPOFOL BIOPSY  Patient Location: PACU  Anesthesia Type:General  Level of Consciousness: awake, alert , oriented and patient cooperative  Airway & Oxygen Therapy: Patient Spontanous Breathing  Post-op Assessment: Report given to RN, Post -op Vital signs reviewed and stable and Patient moving all extremities X 4  Post vital signs: Reviewed and stable  Last Vitals:  Vitals Value Taken Time  BP    Temp    Pulse    Resp    SpO2      Last Pain:  Vitals:   12/23/20 0827  TempSrc:   PainSc: 0-No pain         Complications: No notable events documented.

## 2020-12-23 NOTE — Anesthesia Preprocedure Evaluation (Signed)
Anesthesia Evaluation  Patient identified by MRN, date of birth, ID band Patient awake    Reviewed: Allergy & Precautions, NPO status , Patient's Chart, lab work & pertinent test results  History of Anesthesia Complications (+) PONV and history of anesthetic complications  Airway Mallampati: II  TM Distance: >3 FB Neck ROM: Full   Comment: Neck pain, neck sx Dental  (+) Dental Advisory Given, Missing   Pulmonary former smoker,    Pulmonary exam normal breath sounds clear to auscultation       Cardiovascular Exercise Tolerance: Good hypertension, Pt. on medications Normal cardiovascular exam Rhythm:Regular Rate:Normal     Neuro/Psych negative neurological ROS  negative psych ROS   GI/Hepatic negative GI ROS, Neg liver ROS,   Endo/Other  diabetes, Well Controlled, Type 2, Oral Hypoglycemic Agents  Renal/GU negative Renal ROS  negative genitourinary   Musculoskeletal Neck sx   Abdominal   Peds  Hematology negative hematology ROS (+)   Anesthesia Other Findings Neck pain  Reproductive/Obstetrics negative OB ROS                             Anesthesia Physical Anesthesia Plan  ASA: 2  Anesthesia Plan: General   Post-op Pain Management: Minimal or no pain anticipated   Induction: Intravenous  PONV Risk Score and Plan: TIVA  Airway Management Planned: Nasal Cannula and Natural Airway  Additional Equipment:   Intra-op Plan:   Post-operative Plan:   Informed Consent: I have reviewed the patients History and Physical, chart, labs and discussed the procedure including the risks, benefits and alternatives for the proposed anesthesia with the patient or authorized representative who has indicated his/her understanding and acceptance.     Dental advisory given  Plan Discussed with: CRNA and Surgeon  Anesthesia Plan Comments:         Anesthesia Quick Evaluation

## 2020-12-23 NOTE — Op Note (Addendum)
[facility] Patient Name: Joseph Wolf Procedure Date: 12/23/2020 8:17 AM MRN: [id number] Date of Birth: [birth year] Attending MD: Maylon Peppers ,  CSN: [id number] Age: 75 Admit Type: Outpatient Procedure:                Upper GI endoscopy Indications:              Epigastric abdominal pain, Abdominal bloating Providers:                Maylon Peppers, Janeece Riggers, RN, Kristine L.                            Risa Grill, Technician Referring MD:              Medicines:                Monitored Anesthesia Care Complications:            No immediate complications. Estimated Blood Loss:     Estimated blood loss: none. Procedure:                Pre-Anesthesia Assessment:                           - Prior to the procedure, a History and Physical                            was performed, and patient medications, allergies                            and sensitivities were reviewed. The patient's                            tolerance of previous anesthesia was reviewed.                           - The risks and benefits of the procedure and the                            sedation options and risks were discussed with the                            patient. All questions were answered and informed                            consent was obtained.                           - ASA Grade Assessment: II - A patient with mild                            systemic disease.                           After obtaining informed consent, the endoscope was                            passed under direct vision. Throughout the  procedure, the patient's blood pressure, pulse, and                            oxygen saturations were monitored continuously. The                            [id number]-[id number] ([id number]) scope was introduced through the                            mouth, and advanced to the second part of duodenum.                            The upper GI endoscopy was accomplished  without                            difficulty. The patient tolerated the procedure                            well. Scope In: 8:44:20 AM Scope Out: 8:57:10 AM Total Procedure Duration: 0 hours 12 minutes 50 seconds  Findings:      A 2 cm hiatal hernia was present.      Three 4 to 6 mm sessile polyps with no bleeding were found in the       gastric body. One of the polyps had an inflammatory appearance and was       removed with a hot snare. Resection and retrieval were complete. Polyp       was retrieved with a Jabier Mutton net.      Localized mildly erythematous mucosa without bleeding was found in the       gastric antrum. Biopsies were taken with a cold forceps for Helicobacter       pylori testing.      The examined duodenum was normal. Impression:               - 2 cm hiatal hernia.                           - Three gastric polyps. Resected and retrieved.                           - Erythematous mucosa in the antrum. Biopsied.                           - Normal examined duodenum. Moderate Sedation:      Per Anesthesia Care Recommendation:           - Discharge patient to home (ambulatory).                           - Resume previous diet.                           - Continue Miralax daily and omeprazole 20 mg twice                            a day.                           -  Check H. pylori serology.                           - Await pathology results. Procedure Code(s):        --- Professional ---                           [id number], Esophagogastroduodenoscopy, flexible,                            transoral; with removal of tumor(s), polyp(s), or                            other lesion(s) by snare technique                           43239, 13, Esophagogastroduodenoscopy, flexible,                            transoral; with biopsy, single or multiple Diagnosis Code(s):        --- Professional ---                           K44.9, Diaphragmatic hernia without obstruction or                             gangrene                           K31.7, Polyp of stomach and duodenum                           K31.89, Other diseases of stomach and duodenum                           R10.13, Epigastric pain                           R14.0, Abdominal distension (gaseous) CPT copyright 2019 American [employer] Association. All rights reserved. The codes documented in this report are preliminary and upon coder review may  be revised to meet current compliance requirements. Maylon Peppers, MD Maylon Peppers,  12/23/2020 9:04:34 AM This report has been signed electronically. Number of Addenda: 0

## 2020-12-24 LAB — SURGICAL PATHOLOGY

## 2020-12-25 LAB — H. PYLORI ANTIBODY, IGG: H Pylori IgG: 0.48 Index Value (ref 0.00–0.79)

## 2020-12-26 ENCOUNTER — Encounter (HOSPITAL_COMMUNITY): Payer: Self-pay | Admitting: Gastroenterology

## 2021-02-17 ENCOUNTER — Other Ambulatory Visit (HOSPITAL_COMMUNITY): Payer: Self-pay | Admitting: Neurosurgery

## 2021-02-17 ENCOUNTER — Other Ambulatory Visit: Payer: Self-pay | Admitting: Neurosurgery

## 2021-02-17 DIAGNOSIS — M542 Cervicalgia: Secondary | ICD-10-CM

## 2021-03-03 ENCOUNTER — Other Ambulatory Visit: Payer: Self-pay

## 2021-03-03 ENCOUNTER — Ambulatory Visit (HOSPITAL_COMMUNITY)
Admission: RE | Admit: 2021-03-03 | Discharge: 2021-03-03 | Disposition: A | Discharge status: Home / Self Care | Payer: Medicare Other | Source: Ambulatory Visit | Attending: Neurosurgery | Admitting: Neurosurgery

## 2021-03-03 DIAGNOSIS — M542 Cervicalgia: Secondary | ICD-10-CM | POA: Insufficient documentation

## 2021-06-01 ENCOUNTER — Ambulatory Visit (INDEPENDENT_AMBULATORY_CARE_PROVIDER_SITE_OTHER): Payer: Medicare Other | Admitting: Gastroenterology

## 2021-06-02 IMAGING — DX DG CHEST 1V PORT
1 series · 1 of 1 positions shown · non-contrast
Comparison: None.

CLINICAL DATA: Weakness with vomiting and dizziness

EXAM:
PORTABLE CHEST 1 VIEW

[chest ap]
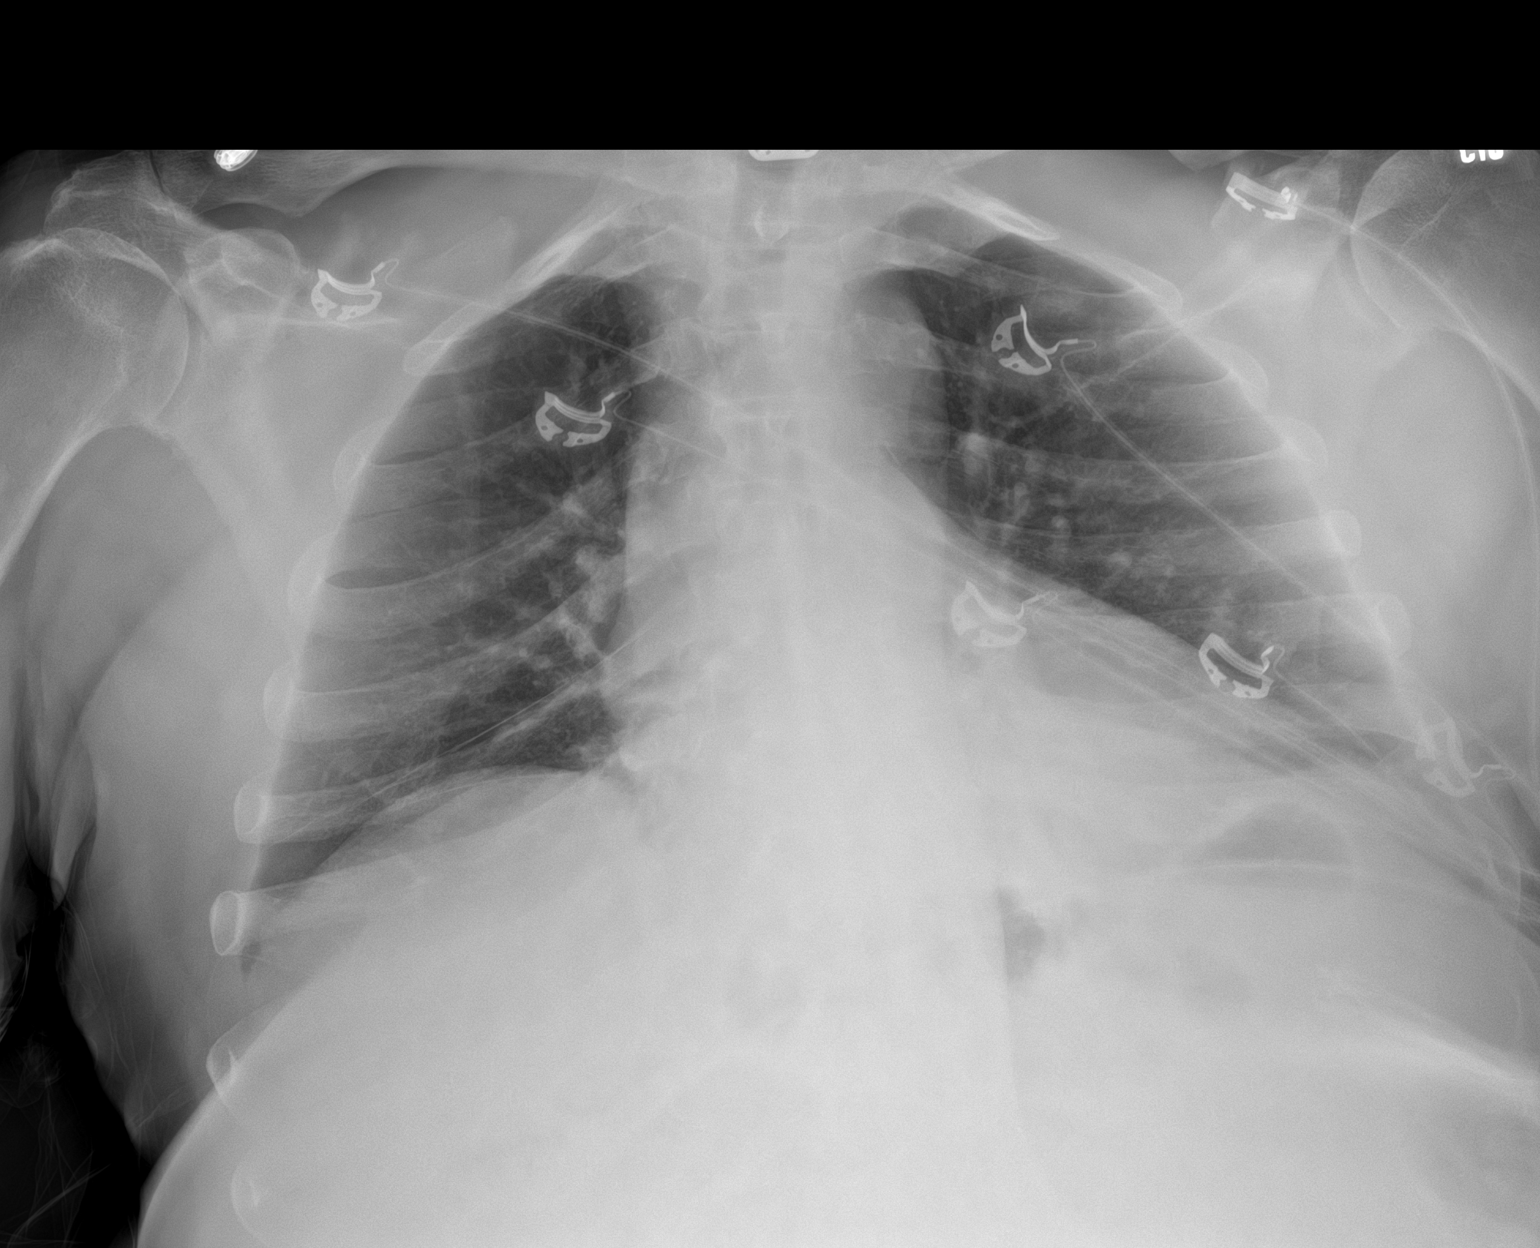

[1 of 1 positions shown; findings below may reference images not displayed]

FINDINGS: Lordotic projection. Negative for heart failure. Cervical ACDF
fusion plate.

Possible left lower lobe atelectasis/infiltrate. This is not well
evaluated on the current projection.
IMPRESSION: Possible left lower lobe atelectasis/infiltrate. If the patient has
acute chest symptoms, follow-up chest two-view recommended.

## 2021-06-22 ENCOUNTER — Ambulatory Visit (INDEPENDENT_AMBULATORY_CARE_PROVIDER_SITE_OTHER): Payer: Medicare Other | Admitting: Gastroenterology

## 2021-06-22 ENCOUNTER — Encounter (INDEPENDENT_AMBULATORY_CARE_PROVIDER_SITE_OTHER): Payer: Self-pay | Admitting: Gastroenterology

## 2021-06-22 VITALS — BP 149/81 | HR 75 | Temp 98.2°F | Ht 69.0 in | Wt 193.3 lb

## 2021-06-22 DIAGNOSIS — K219 Gastro-esophageal reflux disease without esophagitis: Secondary | ICD-10-CM | POA: Diagnosis not present

## 2021-06-22 DIAGNOSIS — K5904 Chronic idiopathic constipation: Secondary | ICD-10-CM | POA: Insufficient documentation

## 2021-06-22 DIAGNOSIS — K5900 Constipation, unspecified: Secondary | ICD-10-CM | POA: Insufficient documentation

## 2021-06-22 NOTE — Progress Notes (Signed)
Referring Provider: Redmond School, MD Primary Care Physician:  Redmond School, MD Primary GI Physician: Jenetta Downer  Chief Complaint  Patient presents with   Gastroesophageal Reflux    6 month follow up on GERD, constipation, bloating, and epigastric pain. Patient states constipation is better since doing one capful of miralax daily, epigastric pain is better and bloating is better. Has one stool daily to one every other day. Appetite is good.    HPI:   Joseph Wolf is a 76 y.o. male with past medical history of DM, high cholesterol, HTN, GERD.  Patient presenting today for follow up of GERD and constipation.   History: Last seen November 2022, at that time he endorsed bloating with upper abdominal/epiagstric discomfort, was taking omeprazole '20mg'$  daily at that time, having 3-4 episodes of acid reflux per week. Sometimes passing flatus which helped with abdominal discomfort, noting that he had worse back pain at times of acid reflux flare. Also noted on 2-3 BMs per week, previously tried linzess 76mg but this gave him diarrhea. Omeprazole was increased to '20mg'$  BID, EGD scheduled, advised to try otc phazyme. Instructed on increasing water intake, high fiber diet and miralax with uptitration directions.   Present: EGD in December with findings as outlined below. Patient continued on omeprazole '20mg'$  BID.  States that he is doing 1 capful of miralax per day, moving bowels atleast once per day with good results. Denies needing to strain. Feels that abdominal discomfort improved once his bowels started moving more regularly. Is drinking plenty of water.   GERD has improved,  states that appetite is good, no early satiety. Epigastric pain occurs very infrequently now. Has occasional heartburn, usually related to something he ate. will take an occasional tums if needed but this is rare. Feels that gas and bloating have improved as well.   No red flag symptoms. Patient denies melena,  hematochezia, nausea, vomiting, diarrhea, dysphagia, odyonophagia, early satiety or weight loss.   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 - External and internal hemorrhoids. - No specimens collected. Last Endoscopy:12/23/20 2 cm hiatal hernia. - Three gastric polyps. Resected and retrieved. - Erythematous mucosa in the antrum. Biopsied. Normal examined duodenum  Recommendations:  Repeat EGD in 3 years   Past Medical History:  Diagnosis Date   Diabetes mellitus [place] complication [place]    High cholesterol    Hypertension    PONV (postoperative nausea and vomiting)     Past Surgical History:  Procedure Laterality Date   BACK SURGERY     2014. Has had four back surgeryes total   BIOPSY  12/23/2020   Procedure: BIOPSY;  Surgeon: CHarvel Quale MD;  [facility];  Service: Gastroenterology;;   COLONOSCOPY N/A 02/26/2015   Procedure: COLONOSCOPY;  Surgeon: NRogene Houston MD;  [facility];  Service: Endoscopy;  Laterality: N/A;  1:00   COLONOSCOPY N/A 11/29/2019   Rehman: melanosis of colon, external/internal hemorrhoids, no specimens   ESOPHAGOGASTRODUODENOSCOPY (EGD) WITH PROPOFOL N/A 12/23/2020   Procedure: ESOPHAGOGASTRODUODENOSCOPY (EGD) WITH PROPOFOL;  Surgeon: CHarvel Quale MD;  [facility];  Service: Gastroenterology;  Laterality: N/A;  8:15   KNEE SURGERY     Left arthro    Current Outpatient Medications  Medication Sig Dispense Refill   acetaminophen (TYLENOL) 500 MG tablet Take 1,000 mg by mouth every 6 (six) hours as needed for mild pain or moderate pain.      ALPRAZolam (XANAX) 0.5 MG tablet Take 0.5 mg by mouth  3 (three) times daily as needed for anxiety.      amLODipine (NORVASC) 10 MG tablet Take 10 mg by mouth daily.     atenolol (TENORMIN) 50 MG tablet Take 50 mg by mouth daily.     cloNIDine (CATAPRES) 0.2 MG tablet Take 0.2 mg by mouth 2 (two) times daily.     empagliflozin (JARDIANCE)  10 MG TABS tablet Take by mouth daily.     fluticasone (FLONASE) 50 MCG/ACT nasal spray Place 1 spray into both nostrils daily as needed for allergies or rhinitis.     gabapentin (NEURONTIN) 300 MG capsule Take 300 mg by mouth 3 (three) times daily.     Levothyroxine Sodium 25 MCG CAPS Take 25 mcg by mouth daily before breakfast.     linaclotide (LINZESS) 72 MCG capsule Take 1 capsule (72 mcg total) by mouth daily before breakfast. (Patient taking differently: Take 72 mcg by mouth daily as needed (Constipation).) 30 capsule 3   losartan (COZAAR) 100 MG tablet Take 100 mg by mouth daily.     metFORMIN (GLUCOPHAGE) 850 MG tablet Take 850 mg by mouth 2 (two) times daily with a meal.     omeprazole (PRILOSEC) 20 MG capsule Take 20 mg by mouth daily.     polyethylene glycol (MIRALAX / GLYCOLAX) 17 g packet Take 17 g by mouth daily. 1 capful daily     potassium chloride SA (KLOR-CON) 20 MEQ tablet Take 20 mEq by mouth daily.      rosuvastatin (CRESTOR) 20 MG tablet Take 20 mg by mouth at bedtime.      traZODone (DESYREL) 100 MG tablet Take 100 mg by mouth at bedtime.     No current facility-administered medications for this visit.    Allergies as of 06/22/2021 - Review Complete 06/22/2021  Allergen Reaction Noted   Atorvastatin  01/05/2006   Pravastatin  01/05/2006   Ramipril Cough 01/05/2006   Simvastatin  01/05/2006   Allegra [fexofenadine] Swelling and Rash 01/22/2015   Keflex [cephalexin] Swelling and Rash 01/22/2015    Family History  Problem Relation Age of Onset   Ulcerative colitis Paternal Grandmother     Social History   Socioeconomic History   Marital status: Married    Spouse name: Not on file   Number of children: Not on file   Years of education: Not on file   Highest education level: Not on file  Occupational History   Not on file  Tobacco Use   Smoking status: Former    Types: Cigarettes    Passive exposure: Past   Smokeless tobacco: Never   Tobacco comments:     quit in 1990  Substance and Sexual Activity   Alcohol use: No    Alcohol/week: 0.0 standard drinks   Drug use: No   Sexual activity: Not on file  Other Topics Concern   Not on file  Social History Narrative   Not on file   Social Determinants of Health   Financial Resource Strain: Not on file  Food Insecurity: Not on file  Transportation Needs: Not on file  Physical Activity: Not on file  Stress: Not on file  Social Connections: Not on file   Review of systems General: negative for malaise, night sweats, fever, chills, weight loss Neck: Negative for lumps, goiter, pain and significant neck swelling Resp: Negative for cough, wheezing, dyspnea at rest CV: Negative for chest pain, leg swelling, palpitations, orthopnea GI: denies melena, hematochezia, nausea, vomiting, diarrhea, constipation dysphagia, odyonophagia, early satiety or  unintentional weight loss. +occasional heartburn  MSK: Negative for joint pain or swelling, back pain, and muscle pain. Derm: Negative for itching or rash Psych: Denies depression, anxiety, memory loss, confusion. No homicidal or suicidal ideation.  Heme: Negative for prolonged bleeding, bruising easily, and swollen nodes. Endocrine: Negative for cold or heat intolerance, polyuria, polydipsia and goiter. Neuro: negative for tremor, gait imbalance, syncope and seizures. The remainder of the review of systems is noncontributory.  Physical Exam: BP (!) 149/81 (BP Location: Left Arm, Patient Position: Sitting, Cuff Size: Large)   Pulse 75   Temp 98.2 F (36.8 C) (Oral)   Ht '5\' 9"'$  (1.753 m)   Wt 193 lb 4.8 oz (87.7 kg)   BMI 28.55 kg/m  General:   Alert and oriented. No distress noted. Pleasant and cooperative.  Head:  Normocephalic and atraumatic. Eyes:  Conjuctiva clear without scleral icterus. Mouth:  Oral mucosa pink and moist. Good dentition. No lesions. Heart: Normal rate and rhythm, s1 and s2 heart sounds present.  Lungs: Clear lung  sounds in all lobes. Respirations equal and unlabored. Abdomen:  +BS, soft, non-tender and non-distended. No rebound or guarding. No HSM or masses noted. Derm: No palmar erythema or jaundice Msk:  Symmetrical without gross deformities. Normal posture. Extremities:  Without edema. Neurologic:  Alert and  oriented x4 Psych:  Alert and cooperative. Normal mood and affect.  Invalid input(s): 6 MONTHS   ASSESSMENT: Joseph Wolf is a 76 y.o. male presenting today for follow up of GERD and constipation.  GERD is much improved with omeprazole '20mg'$  BID, also feels that bloating and abdominal discomfort are better. Having occasional heartburn but he feels this is usually related to something specific that he ate. Will continue with current PPI regimen and  reflux precautions to include Avoiding greasy, spicy, fried, citrus foods, and be mindful that caffeine, carbonated drinks, chocolate and alcohol and staying upright 2-3 hours after eating, prior to lying down and avoid eating late in the evenings. He will make me aware if reflux symptoms become more frequent. Plan for repeat EGD in 3 years for gastric mapping.  Constipation is improved with 1 capful of miralax daily. He continues to stay well hydrated. Having 1 BM per day without rectal bleeding or melena. Appetite is good. Bloating and abdominal discomfort are better since bowels are moving more regularly. Can continue with current regimen, should make sure he is staying well hydrated with diet high in fruits, veggies and whole grains.    PLAN:  Continue omeprazole '20mg'$  BID  ([facility]) 2. Continue miralax 1 capful daily 3. Continue to stay well hydrated with diet high in fruits, veggies, whole grains (especially kiwi and prune) 4. Pt to make me aware of worsening GI symptoms  All questions were answered, patient verbalized understanding and is in agreement with plan as outlined above.    Follow Up: 1 year  Teron Blais L.  Alver Sorrow, MSN, APRN, AGNP-C Adult-Gerontology Nurse Practitioner [facility]

## 2021-06-22 NOTE — Patient Instructions (Signed)
Please continue your omeprazole '20mg'$  twice a day Avoid greasy, spicy, fried, citrus foods, and be mindful that caffeine, carbonated drinks, chocolate and alcohol can increase reflux symptoms. Stay upright 2-3 hours after eating, prior to lying down and avoid eating late in the evenings.  Continue to stay well hydrated with plenty of water, diet high in fruits, veggies and whole grains, kiwi and prune are especially good for constipation  You can continue miralax 1 capful per day with good result. If needed you can increase to two capfuls per day with 1 every 12 hours in the presence of constipation or harder stools.  Follow up 1 year, or sooner if you have any new or worsening GI symptoms

## 2021-10-09 ENCOUNTER — Ambulatory Visit (HOSPITAL_COMMUNITY)
Admission: RE | Admit: 2021-10-09 | Discharge: 2021-10-09 | Disposition: A | Discharge status: Home / Self Care | Payer: Medicare Other | Source: Ambulatory Visit | Attending: Internal Medicine | Admitting: Internal Medicine

## 2021-10-09 ENCOUNTER — Other Ambulatory Visit (HOSPITAL_COMMUNITY): Payer: Self-pay | Admitting: Internal Medicine

## 2021-10-09 DIAGNOSIS — I209 Angina pectoris, unspecified: Secondary | ICD-10-CM | POA: Diagnosis present

## 2021-10-12 ENCOUNTER — Other Ambulatory Visit: Payer: Self-pay

## 2021-10-12 ENCOUNTER — Emergency Department (HOSPITAL_COMMUNITY)
Admission: EM | Admit: 2021-10-12 | Discharge: 2021-10-12 | Disposition: A | Discharge status: Home / Self Care | Payer: Medicare Other | Attending: Student | Admitting: Student

## 2021-10-12 ENCOUNTER — Encounter (HOSPITAL_COMMUNITY): Payer: Self-pay | Admitting: Emergency Medicine

## 2021-10-12 DIAGNOSIS — E119 Type 2 diabetes mellitus without complications: Secondary | ICD-10-CM | POA: Diagnosis not present

## 2021-10-12 DIAGNOSIS — I10 Essential (primary) hypertension: Secondary | ICD-10-CM | POA: Insufficient documentation

## 2021-10-12 DIAGNOSIS — R112 Nausea with vomiting, unspecified: Secondary | ICD-10-CM | POA: Diagnosis not present

## 2021-10-12 DIAGNOSIS — Z79899 Other long term (current) drug therapy: Secondary | ICD-10-CM | POA: Insufficient documentation

## 2021-10-12 DIAGNOSIS — Z7984 Long term (current) use of oral hypoglycemic drugs: Secondary | ICD-10-CM | POA: Diagnosis not present

## 2021-10-12 DIAGNOSIS — R079 Chest pain, unspecified: Secondary | ICD-10-CM

## 2021-10-12 DIAGNOSIS — R0602 Shortness of breath: Secondary | ICD-10-CM | POA: Insufficient documentation

## 2021-10-12 DIAGNOSIS — Z87891 Personal history of nicotine dependence: Secondary | ICD-10-CM | POA: Insufficient documentation

## 2021-10-12 DIAGNOSIS — R109 Unspecified abdominal pain: Secondary | ICD-10-CM | POA: Diagnosis not present

## 2021-10-12 LAB — CBC
HCT: 41.1 % (ref 39.0–52.0)
Hemoglobin: 13.4 g/dL (ref 13.0–17.0)
MCH: 31.1 pg (ref 26.0–34.0)
MCHC: 32.6 g/dL (ref 30.0–36.0)
MCV: 95.4 fL (ref 80.0–100.0)
Platelets: 238 10*3/uL (ref 150–400)
RBC: 4.31 MIL/uL (ref 4.22–5.81)
RDW: 13.4 % (ref 11.5–15.5)
WBC: 7.9 10*3/uL (ref 4.0–10.5)
nRBC: 0 % (ref 0.0–0.2)

## 2021-10-12 LAB — BASIC METABOLIC PANEL
Anion gap: 8 (ref 5–15)
BUN: 23 mg/dL (ref 8–23)
CO2: 23 mmol/L (ref 22–32)
Calcium: 9.3 mg/dL (ref 8.9–10.3)
Chloride: 104 mmol/L (ref 98–111)
Creatinine, Ser: 1.5 mg/dL — ABNORMAL HIGH (ref 0.61–1.24)
GFR, Estimated: 48 mL/min — ABNORMAL LOW (ref >60)
Glucose, Bld: 167 mg/dL — ABNORMAL HIGH (ref 70–99)
Potassium: 4.4 mmol/L (ref 3.5–5.1)
Sodium: 135 mmol/L (ref 135–145)

## 2021-10-12 LAB — TROPONIN I (HIGH SENSITIVITY)
Troponin I (High Sensitivity): 6 ng/L (ref <18)
Troponin I (High Sensitivity): 6 ng/L (ref <18)

## 2021-10-12 NOTE — ED Provider Notes (Signed)
[facility] EMERGENCY DEPARTMENT Provider Note  CSN: [id number] Arrival date & time: 10/12/21 1028  Chief Complaint(s) Chest Pain and Shortness of Breath  HPI Joseph Wolf is a 76 y.o. male with PMH T2DM, HLD, HTN who presents to the emergency department for evaluation of chest pain, shortness of breath and elevated troponin.  Patient states that over the last few months he has had progressively worsening sensation of "discomfort" in his chest when walking long distances.  He saw his primary care physician 3 days ago who obtained outpatient labs and found an elevated troponin at 51.  He was then instructed to come to the emergency department for an ACS rule out.  On arrival, he denies current chest pain, shortness of breath, abdominal pain, nausea, vomiting or other systemic symptoms.   Past Medical History Past Medical History:  Diagnosis Date   Diabetes mellitus without complication ([place])    High cholesterol    Hypertension    PONV (postoperative nausea and vomiting)    Patient Active Problem List   Diagnosis Date Noted   Gastroesophageal reflux disease 06/22/2021   Constipation 06/22/2021   Zoster 11/29/2018   T2DM (type 2 diabetes mellitus) ([place]) 11/29/2018   Hypokalemia 11/29/2018   Hypomagnesemia 11/29/2018   Dyslipidemia 11/29/2018   Acute hyponatremia 11/27/2018   Essential hypertension 01/22/2015   High cholesterol 01/22/2015   Home Medication(s) Prior to Admission medications   Medication Sig Start Date End Date Taking? Authorizing Provider  acetaminophen (TYLENOL) 500 MG tablet Take 1,000 mg by mouth every 6 (six) hours as needed for mild pain or moderate pain.     [provider]  ALPRAZolam Duanne Moron) 0.5 MG tablet Take 0.5 mg by mouth 3 (three) times daily as needed for anxiety.  01/08/15   [provider]  amLODipine (NORVASC) 10 MG tablet Take 10 mg by mouth daily.    [provider]  atenolol (TENORMIN) 50 MG tablet Take 50 mg by mouth  daily. 08/06/20   [provider]  cloNIDine (CATAPRES) 0.2 MG tablet Take 0.2 mg by mouth 2 (two) times daily.    [provider]  empagliflozin (JARDIANCE) 10 MG TABS tablet Take by mouth daily.    [provider]  fluticasone (FLONASE) 50 MCG/ACT nasal spray Place 1 spray into both nostrils daily as needed for allergies or rhinitis.    [provider]  gabapentin (NEURONTIN) 300 MG capsule Take 300 mg by mouth 3 (three) times daily. 11/18/19   [provider]  Levothyroxine Sodium 25 MCG CAPS Take 25 mcg by mouth daily before breakfast.    [provider]  linaclotide (LINZESS) 72 MCG capsule Take 1 capsule (72 mcg total) by mouth daily before breakfast. Patient taking differently: Take 72 mcg by mouth daily as needed (Constipation). 10/11/19   Ezzard Standing, PA-C  losartan (COZAAR) 100 MG tablet Take 100 mg by mouth daily.    [provider]  metFORMIN (GLUCOPHAGE) 850 MG tablet Take 850 mg by mouth 2 (two) times daily with a meal.    [provider]  omeprazole (PRILOSEC) 20 MG capsule Take 20 mg by mouth daily.    [provider]  polyethylene glycol (MIRALAX / GLYCOLAX) 17 g packet Take 17 g by mouth daily. 1 capful daily    [provider]  potassium chloride SA (KLOR-CON) 20 MEQ tablet Take 20 mEq by mouth daily.     [provider]  rosuvastatin (CRESTOR) 20 MG tablet Take 20 mg  by mouth at bedtime.     [provider]  traZODone (DESYREL) 100 MG tablet Take 100 mg by mouth at bedtime. 10/31/19   [provider]                                                                                                                                    Past Surgical History Past Surgical History:  Procedure Laterality Date   BACK SURGERY     2014. Has had four back surgeryes total   BIOPSY  12/23/2020   Procedure: BIOPSY;  Surgeon: Harvel Quale, MD;  [facility];  Service: Gastroenterology;;   COLONOSCOPY N/A 02/26/2015   Procedure: COLONOSCOPY;  Surgeon: Rogene Houston, MD;  [facility];  Service: Endoscopy;  Laterality: N/A;  1:00   COLONOSCOPY N/A 11/29/2019   Rehman: melanosis of colon, external/internal hemorrhoids, no specimens   ESOPHAGOGASTRODUODENOSCOPY (EGD) WITH PROPOFOL N/A 12/23/2020   Procedure: ESOPHAGOGASTRODUODENOSCOPY (EGD) WITH PROPOFOL;  Surgeon: Harvel Quale, MD;  [facility];  Service: Gastroenterology;  Laterality: N/A;  8:15   KNEE SURGERY     Left arthro   Family History Family History  Problem Relation Age of Onset   Ulcerative colitis Paternal Grandmother     Social History Social History   Tobacco Use   Smoking status: Former    Types: Cigarettes    Passive exposure: Past   Smokeless tobacco: Never   Tobacco comments:    quit in 1990  Substance Use Topics   Alcohol use: No    Alcohol/week: 0.0 standard drinks of alcohol   Drug use: No   Allergies Atorvastatin, Pravastatin, Ramipril, Simvastatin, Allegra [fexofenadine], and Keflex [cephalexin]  Review of Systems Review of Systems  Respiratory:  Positive for chest tightness.   Cardiovascular:  Positive for chest pain.    Physical Exam Vital Signs  I have reviewed the triage vital signs BP (!) 164/88   Pulse 78   Temp 98.2 F (36.8 C) (Oral)   Resp 17   SpO2 97%   Physical Exam Constitutional:      General: He is not in acute distress.    Appearance: Normal appearance.  HENT:     Head: Normocephalic and atraumatic.     Nose: No congestion or rhinorrhea.  Eyes:     General:        Right eye: No discharge.        Left eye: No discharge.     Extraocular Movements: Extraocular movements intact.     Pupils: Pupils are equal, round, and reactive to light.  Cardiovascular:     Rate and Rhythm: Normal rate and regular rhythm.     Heart sounds: No murmur heard. Pulmonary:     Effort: No respiratory  distress.     Breath sounds: No wheezing or rales.  Abdominal:     General: There is no distension.  Tenderness: There is no abdominal tenderness.  Musculoskeletal:        General: Normal range of motion.     Cervical back: Normal range of motion.  Skin:    General: Skin is warm and dry.  Neurological:     General: No focal deficit present.     Mental Status: He is alert.     ED Results and Treatments Labs (all labs ordered are listed, but only abnormal results are displayed) Labs Reviewed  BASIC METABOLIC PANEL - Abnormal; Notable for the following components:      Result Value   Glucose, Bld 167 (*)    Creatinine, Ser 1.50 (*)    GFR, Estimated 48 (*)    All other components within normal limits  CBC  TROPONIN I (HIGH SENSITIVITY)                                                                                                                          Radiology No results found.  Pertinent labs & imaging results that were available during my care of the patient were reviewed by me and considered in my medical decision making (see MDM for details).  Medications Ordered in ED Medications - No data to display                                                                                                                                   Procedures Procedures  (including critical care time)  Medical Decision Making / ED Course   This patient presents to the ED for concern of chest pain, exertional dyspnea, this involves an extensive number of treatment options, and is a complaint that carries with it a high risk of complications and morbidity.  The differential diagnosis includes ACS, unstable angina, NSTEMI  MDM: Patient seen emergency department for evaluation of exertional dyspnea and chest pain.  Physical exam largely unremarkable with no appreciable lower extremity edema or rales on lung exam.  No murmur.  Laboratory evaluation with a creatinine of 1.5 but is  otherwise reassuring with normal troponin and delta troponin.  ECG with a right bundle branch block but it does appear similar to previous.  Given no current chest pain and normal troponins, I reached back out to his primary care physician Dr. Gerarda Fraction and it appears the patient has next day follow-up with Dr. Domenic Polite in the cardiology office.  We have  shared decision making decided that this would likely be sufficient for close follow-up for the patient but he was given very strict return precautions of which she voiced understanding he was ultimately discharged with next a cardiology follow-up.   Additional history obtained: -Additional history obtained from wife -External records from outside source obtained and reviewed including: Chart review including previous notes, labs, imaging, consultation notes   Lab Tests: -I ordered, reviewed, and interpreted labs.   The pertinent results include:   Labs Reviewed  BASIC METABOLIC PANEL - Abnormal; Notable for the following components:      Result Value   Glucose, Bld 167 (*)    Creatinine, Ser 1.50 (*)    GFR, Estimated 48 (*)    All other components within normal limits  CBC  TROPONIN I (HIGH SENSITIVITY)      EKG   EKG Interpretation  Date/Time:  Monday October 12 2021 10:47:17 EDT Ventricular Rate:  77 PR Interval:  190 QRS Duration: 138 QT Interval:  392 QTC Calculation: 443 R Axis:   0 Text Interpretation: Normal sinus rhythm Right bundle branch block T wave abnormality, consider inferolateral ischemia Abnormal ECG Confirmed by [place] ([id number]) on 10/12/2021 1:57:45 PM         Medicines ordered and prescription drug management: No orders of the defined types were placed in this encounter.   -I have reviewed the patients home medicines and have made adjustments as needed  Critical interventions none  Consultations Obtained: I requested consultation with the patient's primary care physician,  and discussed lab and  imaging findings as well as pertinent plan - they agreed that next day follow-up with cardiology would be appropriate   Cardiac Monitoring: The patient was maintained on a cardiac monitor.  I personally viewed and interpreted the cardiac monitored which showed an underlying rhythm of: NSR  Social Determinants of Health:  Factors impacting patients care include: none   Reevaluation: After the interventions noted above, I reevaluated the patient and found that they have :stayed the same  Co morbidities that complicate the patient evaluation  Past Medical History:  Diagnosis Date   Diabetes mellitus without complication ([place])    High cholesterol    Hypertension    PONV (postoperative nausea and vomiting)       Dispostion: I considered admission for this patient, and although his story is concerning for unstable angina, he has next-day follow-up with cardiology and at this time does not meet inpatient criteria for admission but will be discharged with strict return precautions.     Final Clinical Impression(s) / ED Diagnoses Final diagnoses:  None     '@PCDICTATION'$ @    Teressa Lower, MD 10/12/21 [phone]

## 2021-10-12 NOTE — ED Notes (Signed)
Discharge instructions reviewed with pt and family. 20g IV cath removed from pt's right antecubital area with tip of catheter intact. Sterile gauze, bandaid and pressure applied until bleeding stopped. Pt ambulated out of the ED with steady gait. Denies pain at time of discharge.

## 2021-10-12 NOTE — ED Notes (Signed)
Ekg given to EDP

## 2021-10-12 NOTE — ED Triage Notes (Signed)
Pt c/o chest tightness across chest and sob with activity x 1 month intermittent. Seen pcp Friday 10/09/21 and labs obtained. Office of Dr Gerarda Fraction called him this am and advised to come to Ed due to elevated enzymes. Pt arrived a/o. Nad. Non diaphoretic, no sob noted. Denies dizziness. States does have some nausea sometimes but better after resting.

## 2021-10-13 ENCOUNTER — Encounter: Payer: Self-pay | Admitting: Cardiology

## 2021-10-13 ENCOUNTER — Other Ambulatory Visit: Payer: Self-pay | Admitting: Cardiology

## 2021-10-13 ENCOUNTER — Ambulatory Visit: Payer: Medicare Other | Attending: Cardiology | Admitting: Cardiology

## 2021-10-13 VITALS — BP 126/72 | HR 75 | Ht 68.0 in | Wt 192.4 lb

## 2021-10-13 DIAGNOSIS — I200 Unstable angina: Secondary | ICD-10-CM

## 2021-10-13 DIAGNOSIS — E782 Mixed hyperlipidemia: Secondary | ICD-10-CM

## 2021-10-13 DIAGNOSIS — N1832 Chronic kidney disease, stage 3b: Secondary | ICD-10-CM | POA: Diagnosis not present

## 2021-10-13 DIAGNOSIS — I10 Essential (primary) hypertension: Secondary | ICD-10-CM | POA: Diagnosis not present

## 2021-10-13 DIAGNOSIS — R0609 Other forms of dyspnea: Secondary | ICD-10-CM | POA: Diagnosis not present

## 2021-10-13 MED ORDER — SODIUM CHLORIDE 0.9% FLUSH: 3.0000 mL | Freq: Two times a day (BID) | INTRAVENOUS | Status: AC

## 2021-10-13 MED ORDER — ASPIRIN 81 MG PO TBEC: 81.0000 mg | DELAYED_RELEASE_TABLET | Freq: Every day | ORAL | 12 refills | Status: AC

## 2021-10-13 NOTE — H&P (View-Only) (Signed)
Cardiology Office Note  Date: 10/13/2021   ID: Joseph, Wolf [birth year], MRN [id number]  PCP:  Redmond School, MD  Cardiologist:  Rozann Lesches, MD Electrophysiologist:  None   Chief Complaint  Patient presents with   Shortness of Breath    History of Present Illness: Joseph Wolf is a 76 y.o. male referred for cardiology consultation by Dr. Gerarda Fraction for the evaluation of chest discomfort and shortness of breath.  I reviewed the available records.  Chart review finds that the patient was just seen in the ER at [facility] yesterday for evaluation of similar symptoms, it was decided that he should keep his visit with me as an outpatient today.  High-sensitivity troponin I levels were normal.  ECG showed sinus rhythm with right bundle branch block and nonspecific ST-T wave changes.  He is here with his wife to discuss symptoms.  He states that over the last month he has been experiencing worsening shortness of breath with activity and also recurrent chest tightness concerning for accelerating angina.  No rest symptoms.  Stamina has also been decreased.  He has had no palpitations or syncope.  He recalls undergoing remote outpatient cardiac work-up by Dr. Mathis Bud with [facility] many years ago, does not recall any substantial degree of CAD or prior PCI.  He follows with Dr. Theador Hawthorne with history of CKD stage 3a.  Recent creatinine ranging 1.2-1.5.  Cardiac risk factors include age and gender along with type 2 diabetes mellitus, hypertension, and mixed hyperlipidemia.  Past Medical History:  Diagnosis Date   Anxiety    Aortic atherosclerosis (HCC)    BPH (benign prostatic hyperplasia)    Cervical spondylosis    CKD (chronic kidney disease) stage 3, GFR 30-59 ml/min (HCC)    Essential hypertension    Hypothyroidism    Mixed hyperlipidemia    Osteoarthritis    Type 2 diabetes mellitus ([place])     Past Surgical History:  Procedure Laterality Date   BACK SURGERY     2014. Has had four  back surgeryes total   BIOPSY  12/23/2020   Procedure: BIOPSY;  Surgeon: Harvel Quale, MD;  [facility];  Service: Gastroenterology;;   COLONOSCOPY N/A 02/26/2015   Procedure: COLONOSCOPY;  Surgeon: Rogene Houston, MD;  [facility];  Service: Endoscopy;  Laterality: N/A;  1:00   COLONOSCOPY N/A 11/29/2019   Rehman: melanosis of colon, external/internal hemorrhoids, no specimens   ESOPHAGOGASTRODUODENOSCOPY (EGD) WITH PROPOFOL N/A 12/23/2020   Procedure: ESOPHAGOGASTRODUODENOSCOPY (EGD) WITH PROPOFOL;  Surgeon: Harvel Quale, MD;  [facility];  Service: Gastroenterology;  Laterality: N/A;  8:15   KNEE SURGERY     Left arthro    Current Outpatient Medications  Medication Sig Dispense Refill   acetaminophen (TYLENOL) 500 MG tablet Take 1,000 mg by mouth every 6 (six) hours as needed for mild pain or moderate pain.      ALPRAZolam (XANAX) 0.5 MG tablet Take 0.5 mg by mouth 3 (three) times daily as needed for anxiety.      amLODipine (NORVASC) 10 MG tablet Take 10 mg by mouth daily.     aspirin EC 81 MG tablet Take 1 tablet (81 mg total) by mouth daily. Swallow whole. 30 tablet 12   atenolol (TENORMIN) 50 MG tablet Take 50 mg by mouth daily.     cloNIDine (CATAPRES) 0.2 MG tablet Take 0.2 mg by mouth 2 (two) times daily.     empagliflozin (JARDIANCE) 10 MG TABS  tablet Take by mouth daily.     fluticasone (FLONASE) 50 MCG/ACT nasal spray Place 1 spray into both nostrils daily as needed for allergies or rhinitis.     gabapentin (NEURONTIN) 300 MG capsule Take 300 mg by mouth 3 (three) times daily.     Levothyroxine Sodium 25 MCG CAPS Take 25 mcg by mouth daily before breakfast.     linaclotide (LINZESS) 72 MCG capsule Take 1 capsule (72 mcg total) by mouth daily before breakfast. (Patient taking differently: Take 72 mcg by mouth daily as needed (Constipation).) 30 capsule 3   losartan (COZAAR) 100 MG tablet Take 100 mg by mouth daily.      metFORMIN (GLUCOPHAGE) 850 MG tablet Take 850 mg by mouth 2 (two) times daily with a meal.     omeprazole (PRILOSEC) 20 MG capsule Take 20 mg by mouth daily.     polyethylene glycol (MIRALAX / GLYCOLAX) 17 g packet Take 17 g by mouth daily. 1 capful daily     rosuvastatin (CRESTOR) 20 MG tablet Take 20 mg by mouth at bedtime.      traZODone (DESYREL) 100 MG tablet Take 100 mg by mouth at bedtime.     No current facility-administered medications for this visit.   Allergies:  Atorvastatin, Pravastatin, Ramipril, Simvastatin, Allegra [fexofenadine], and Keflex [cephalexin]   Social History: The patient  reports that he has quit smoking. His smoking use included cigarettes. He has been exposed to tobacco smoke. He has never used smokeless tobacco. He reports that he does not drink alcohol and does not use drugs.   Family History: The patient's family history includes Hypertension in his mother; Pancreatic cancer in his father; Stroke in his maternal grandmother and mother; Ulcerative colitis in his paternal grandmother.   ROS: Mild ankle edema, dependent.  No orthopnea or PND.  Physical Exam: VS:  BP 126/72   Pulse 75   Ht '5\' 8"'$  (1.727 m)   Wt 192 lb 6.4 oz (87.3 kg)   SpO2 97%   BMI 29.25 kg/m , BMI Body mass index is 29.25 kg/m.  Wt Readings from Last 3 Encounters:  10/13/21 192 lb 6.4 oz (87.3 kg)  06/22/21 193 lb 4.8 oz (87.7 kg)  12/18/20 192 lb (87.1 kg)    General: Patient appears comfortable at rest. HEENT: Conjunctiva and lids normal. Neck: Supple, no elevated JVP or carotid bruits, no thyromegaly. Lungs: Clear to auscultation, nonlabored breathing at rest. Cardiac: Regular rate and rhythm, no S3, 1/6 systolic murmur, no pericardial rub. Abdomen: Soft, nontender, bowel sounds present. Extremities: No pitting edema, distal pulses 2+. Skin: Warm and dry. Musculoskeletal: No kyphosis. Neuropsychiatric: Alert and oriented x3, affect grossly appropriate.  ECG:  An ECG  dated 10/12/2021 was personally reviewed today and demonstrated:  Sinus rhythm with right bundle branch block and nonspecific ST-T wave changes.  Recent Labwork: August 2023: TSH 2.16, creatinine 1.22, LDL 58, AST 18, ALT 19 hemoglobin 14.4, platelets 257 hemoglobin A1c 6.7% 10/12/2021: BUN 23; Creatinine, Ser 1.50; Hemoglobin 13.4; Platelets 238; Potassium 4.4; Sodium 135   Other Studies Reviewed Today:  Chest x-ray 10/09/2021: FINDINGS: No focal consolidation, pleural effusion, or pneumothorax. Normal cardiomediastinal silhouette. No acute osseous abnormality. Cervical spine fusion hardware   IMPRESSION: No active cardiopulmonary disease.  Assessment and Plan:  1.  Dyspnea on exertion and accelerating exertional angina in a 76 year old male with type 2 diabetes mellitus, hypertension, hyperlipidemia, aortic atherosclerosis, and CKD stage IIIa-b.  Recent creatinine 1.2-1.5 range.  Recent ER visit with normal  high-sensitivity troponin I levels.  ECG shows right bundle branch block and nonspecific ST-T wave changes.  We discussed the risks and benefits of a diagnostic cardiac catheterization to assess for revascularization options and he is in agreement to proceed.  Starting aspirin 81 mg daily.  Continue Norvasc, atenolol, Jardiance, losartan, and Crestor.  2.  CKD stage IIIa-b, recent creatinine 1.2-1.5 range.  3.  Essential hypertension, no changes made to current regimen.  4.  Mixed hyperlipidemia with statin intolerance, although has done well with Crestor.  Recent LDL 58.  Medication Adjustments/Labs and Tests Ordered: Current medicines are reviewed at length with the patient today.  Concerns regarding medicines are outlined above.   Tests Ordered: No orders of the defined types were placed in this encounter.   Medication Changes: Meds ordered this encounter  Medications   aspirin EC 81 MG tablet    Sig: Take 1 tablet (81 mg total) by mouth daily. Swallow whole.    Dispense:   30 tablet    Refill:  12    Disposition:  Follow up  after testing.  Signed, Satira Sark, MD, [facility] 10/13/2021 1:51 PM    [place] at [facility] [address], [place],  [address] Phone: [phone]; Fax: [phone]

## 2021-10-13 NOTE — Progress Notes (Signed)
Cardiology Office Note  Date: 10/13/2021   ID: Muath, Hallam [birth year], MRN [id number]  PCP:  Redmond School, MD  Cardiologist:  Rozann Lesches, MD Electrophysiologist:  None   Chief Complaint  Patient presents with   Shortness of Breath    History of Present Illness: Joseph Wolf is a 76 y.o. male referred for cardiology consultation by Dr. Gerarda Fraction for the evaluation of chest discomfort and shortness of breath.  I reviewed the available records.  Chart review finds that the patient was just seen in the ER at [facility] yesterday for evaluation of similar symptoms, it was decided that he should keep his visit with me as an outpatient today.  High-sensitivity troponin I levels were normal.  ECG showed sinus rhythm with right bundle branch block and nonspecific ST-T wave changes.  He is here with his wife to discuss symptoms.  He states that over the last month he has been experiencing worsening shortness of breath with activity and also recurrent chest tightness concerning for accelerating angina.  No rest symptoms.  Stamina has also been decreased.  He has had no palpitations or syncope.  He recalls undergoing remote outpatient cardiac work-up by Dr. Mathis Bud with [facility] many years ago, does not recall any substantial degree of CAD or prior PCI.  He follows with Dr. Theador Hawthorne with history of CKD stage 3a.  Recent creatinine ranging 1.2-1.5.  Cardiac risk factors include age and gender along with type 2 diabetes mellitus, hypertension, and mixed hyperlipidemia.  Past Medical History:  Diagnosis Date   Anxiety    Aortic atherosclerosis (HCC)    BPH (benign prostatic hyperplasia)    Cervical spondylosis    CKD (chronic kidney disease) stage 3, GFR 30-59 ml/min (HCC)    Essential hypertension    Hypothyroidism    Mixed hyperlipidemia    Osteoarthritis    Type 2 diabetes mellitus ([place])     Past Surgical History:  Procedure Laterality Date   BACK SURGERY     2014. Has had four  back surgeryes total   BIOPSY  12/23/2020   Procedure: BIOPSY;  Surgeon: Harvel Quale, MD;  [facility];  Service: Gastroenterology;;   COLONOSCOPY N/A 02/26/2015   Procedure: COLONOSCOPY;  Surgeon: Rogene Houston, MD;  [facility];  Service: Endoscopy;  Laterality: N/A;  1:00   COLONOSCOPY N/A 11/29/2019   Rehman: melanosis of colon, external/internal hemorrhoids, no specimens   ESOPHAGOGASTRODUODENOSCOPY (EGD) WITH PROPOFOL N/A 12/23/2020   Procedure: ESOPHAGOGASTRODUODENOSCOPY (EGD) WITH PROPOFOL;  Surgeon: Harvel Quale, MD;  [facility];  Service: Gastroenterology;  Laterality: N/A;  8:15   KNEE SURGERY     Left arthro    Current Outpatient Medications  Medication Sig Dispense Refill   acetaminophen (TYLENOL) 500 MG tablet Take 1,000 mg by mouth every 6 (six) hours as needed for mild pain or moderate pain.      ALPRAZolam (XANAX) 0.5 MG tablet Take 0.5 mg by mouth 3 (three) times daily as needed for anxiety.      amLODipine (NORVASC) 10 MG tablet Take 10 mg by mouth daily.     aspirin EC 81 MG tablet Take 1 tablet (81 mg total) by mouth daily. Swallow whole. 30 tablet 12   atenolol (TENORMIN) 50 MG tablet Take 50 mg by mouth daily.     cloNIDine (CATAPRES) 0.2 MG tablet Take 0.2 mg by mouth 2 (two) times daily.     empagliflozin (JARDIANCE) 10 MG TABS  tablet Take by mouth daily.     fluticasone (FLONASE) 50 MCG/ACT nasal spray Place 1 spray into both nostrils daily as needed for allergies or rhinitis.     gabapentin (NEURONTIN) 300 MG capsule Take 300 mg by mouth 3 (three) times daily.     Levothyroxine Sodium 25 MCG CAPS Take 25 mcg by mouth daily before breakfast.     linaclotide (LINZESS) 72 MCG capsule Take 1 capsule (72 mcg total) by mouth daily before breakfast. (Patient taking differently: Take 72 mcg by mouth daily as needed (Constipation).) 30 capsule 3   losartan (COZAAR) 100 MG tablet Take 100 mg by mouth daily.      metFORMIN (GLUCOPHAGE) 850 MG tablet Take 850 mg by mouth 2 (two) times daily with a meal.     omeprazole (PRILOSEC) 20 MG capsule Take 20 mg by mouth daily.     polyethylene glycol (MIRALAX / GLYCOLAX) 17 g packet Take 17 g by mouth daily. 1 capful daily     rosuvastatin (CRESTOR) 20 MG tablet Take 20 mg by mouth at bedtime.      traZODone (DESYREL) 100 MG tablet Take 100 mg by mouth at bedtime.     No current facility-administered medications for this visit.   Allergies:  Atorvastatin, Pravastatin, Ramipril, Simvastatin, Allegra [fexofenadine], and Keflex [cephalexin]   Social History: The patient  reports that he has quit smoking. His smoking use included cigarettes. He has been exposed to tobacco smoke. He has never used smokeless tobacco. He reports that he does not drink alcohol and does not use drugs.   Family History: The patient's family history includes Hypertension in his mother; Pancreatic cancer in his father; Stroke in his maternal grandmother and mother; Ulcerative colitis in his paternal grandmother.   ROS: Mild ankle edema, dependent.  No orthopnea or PND.  Physical Exam: VS:  BP 126/72   Pulse 75   Ht '5\' 8"'$  (1.727 m)   Wt 192 lb 6.4 oz (87.3 kg)   SpO2 97%   BMI 29.25 kg/m , BMI Body mass index is 29.25 kg/m.  Wt Readings from Last 3 Encounters:  10/13/21 192 lb 6.4 oz (87.3 kg)  06/22/21 193 lb 4.8 oz (87.7 kg)  12/18/20 192 lb (87.1 kg)    General: Patient appears comfortable at rest. HEENT: Conjunctiva and lids normal. Neck: Supple, no elevated JVP or carotid bruits, no thyromegaly. Lungs: Clear to auscultation, nonlabored breathing at rest. Cardiac: Regular rate and rhythm, no S3, 1/6 systolic murmur, no pericardial rub. Abdomen: Soft, nontender, bowel sounds present. Extremities: No pitting edema, distal pulses 2+. Skin: Warm and dry. Musculoskeletal: No kyphosis. Neuropsychiatric: Alert and oriented x3, affect grossly appropriate.  ECG:  An ECG  dated 10/12/2021 was personally reviewed today and demonstrated:  Sinus rhythm with right bundle branch block and nonspecific ST-T wave changes.  Recent Labwork: August 2023: TSH 2.16, creatinine 1.22, LDL 58, AST 18, ALT 19 hemoglobin 14.4, platelets 257 hemoglobin A1c 6.7% 10/12/2021: BUN 23; Creatinine, Ser 1.50; Hemoglobin 13.4; Platelets 238; Potassium 4.4; Sodium 135   Other Studies Reviewed Today:  Chest x-ray 10/09/2021: FINDINGS: No focal consolidation, pleural effusion, or pneumothorax. Normal cardiomediastinal silhouette. No acute osseous abnormality. Cervical spine fusion hardware   IMPRESSION: No active cardiopulmonary disease.  Assessment and Plan:  1.  Dyspnea on exertion and accelerating exertional angina in a 76 year old male with type 2 diabetes mellitus, hypertension, hyperlipidemia, aortic atherosclerosis, and CKD stage IIIa-b.  Recent creatinine 1.2-1.5 range.  Recent ER visit with normal  high-sensitivity troponin I levels.  ECG shows right bundle branch block and nonspecific ST-T wave changes.  We discussed the risks and benefits of a diagnostic cardiac catheterization to assess for revascularization options and he is in agreement to proceed.  Starting aspirin 81 mg daily.  Continue Norvasc, atenolol, Jardiance, losartan, and Crestor.  2.  CKD stage IIIa-b, recent creatinine 1.2-1.5 range.  3.  Essential hypertension, no changes made to current regimen.  4.  Mixed hyperlipidemia with statin intolerance, although has done well with Crestor.  Recent LDL 58.  Medication Adjustments/Labs and Tests Ordered: Current medicines are reviewed at length with the patient today.  Concerns regarding medicines are outlined above.   Tests Ordered: No orders of the defined types were placed in this encounter.   Medication Changes: Meds ordered this encounter  Medications   aspirin EC 81 MG tablet    Sig: Take 1 tablet (81 mg total) by mouth daily. Swallow whole.    Dispense:   30 tablet    Refill:  12    Disposition:  Follow up  after testing.  Signed, Satira Sark, MD, [facility] 10/13/2021 1:51 PM    [place] at [facility] [address], [place], [place] [address] Phone: [phone]; Fax: [phone]

## 2021-10-13 NOTE — Patient Instructions (Signed)
  [place] A DEPT OF [place] A DEPT OF [place]. [facility] [place] [id number] [place] [address] Dept: [phone] Loc: [phone]  Joseph Wolf  10/13/2021  You are scheduled for a Cardiac Catheterization on Friday, September 29 with Dr. Shelva Majestic.  1. Please arrive at the [facility] (Main Entrance A) at [facility]: [address] [place], [place] [address] at 10:00 AM (This time is two hours before your procedure to ensure your preparation). Free valet parking service is available.   Special note: Every effort is made to have your procedure done on time. Please understand that emergencies sometimes delay scheduled procedures.  2. Diet: Do not eat solid foods after midnight.  The patient may have clear liquids until 5am upon the day of the procedure.  3. Labs: You will need to have blood drawn on : Labs done yesterday in ED   4. Medication instructions in preparation for your procedure:   Contrast Allergy: No  HOLD Losartan the morning of cath    Do not take Diabetes Med Glucophage (Metformin) and Jardiance  on the day of the procedure and HOLD 48 HOURS AFTER THE PROCEDURE.  On the morning of your procedure, take your Aspirin 81 mg and any morning medicines NOT listed above.  You may use sips of water.  5. Plan for one night stay--bring personal belongings. 6. Bring a current list of your medications and current insurance cards. 7. You MUST have a responsible person to drive you home. 8. Someone MUST be with you the first 24 hours after you arrive home or your discharge will be delayed. 9. Please wear clothes that are easy to get on and off and wear slip-on shoes.  Thank you for allowing [place] to care for you!   -- [facility]

## 2021-10-15 ENCOUNTER — Telehealth: Payer: Self-pay | Admitting: *Deleted

## 2021-10-15 NOTE — Telephone Encounter (Signed)
Cardiac Catheterization scheduled at [facility] for: Friday October 16, 2021 12 Noon Arrival time and place: [facility] Main Entrance A at: 10 AM  Nothing to eat after midnight prior to procedure, clear liquids until 5 AM day of procedure.  Medication instructions: -Hold:  Metformin-day of procedure and 48 hours post procedure  Jardiance -AM of procedure   Losartan/Spironolactone-day before and day of procedure-per protocol 48- pt already taken today -Except hold medications usual morning medications can be taken with sips of water including aspirin 81 mg.  Confirmed patient has responsible adult to drive home post procedure and be with patient first 24 hours after arriving home.  Patient reports no new symptoms concerning for COVID-19 in the past 10 days.  Reviewed procedure instructions with patient, encouraged him to stay well hydrated today.

## 2021-10-16 ENCOUNTER — Ambulatory Visit (HOSPITAL_COMMUNITY)
Admission: RE | Admit: 2021-10-16 | Discharge: 2021-10-16 | Disposition: A | Discharge status: Home / Self Care | Payer: Medicare Other | Source: Ambulatory Visit | Attending: Cardiovascular Disease | Admitting: Cardiovascular Disease

## 2021-10-16 ENCOUNTER — Other Ambulatory Visit: Payer: Self-pay

## 2021-10-16 ENCOUNTER — Encounter (HOSPITAL_COMMUNITY)
Admission: RE | Disposition: A | Discharge status: Home / Self Care | Payer: Self-pay | Source: Ambulatory Visit | Attending: Cardiovascular Disease

## 2021-10-16 DIAGNOSIS — I129 Hypertensive chronic kidney disease with stage 1 through stage 4 chronic kidney disease, or unspecified chronic kidney disease: Secondary | ICD-10-CM | POA: Diagnosis not present

## 2021-10-16 DIAGNOSIS — Z87891 Personal history of nicotine dependence: Secondary | ICD-10-CM | POA: Diagnosis not present

## 2021-10-16 DIAGNOSIS — E782 Mixed hyperlipidemia: Secondary | ICD-10-CM | POA: Diagnosis not present

## 2021-10-16 DIAGNOSIS — I200 Unstable angina: Secondary | ICD-10-CM

## 2021-10-16 DIAGNOSIS — Z7984 Long term (current) use of oral hypoglycemic drugs: Secondary | ICD-10-CM | POA: Insufficient documentation

## 2021-10-16 DIAGNOSIS — I700 Atherosclerosis of aorta: Secondary | ICD-10-CM | POA: Insufficient documentation

## 2021-10-16 DIAGNOSIS — I25119 Atherosclerotic heart disease of native coronary artery with unspecified angina pectoris: Secondary | ICD-10-CM | POA: Diagnosis not present

## 2021-10-16 DIAGNOSIS — N1831 Chronic kidney disease, stage 3a: Secondary | ICD-10-CM | POA: Insufficient documentation

## 2021-10-16 DIAGNOSIS — Z79899 Other long term (current) drug therapy: Secondary | ICD-10-CM | POA: Insufficient documentation

## 2021-10-16 DIAGNOSIS — R0609 Other forms of dyspnea: Secondary | ICD-10-CM | POA: Diagnosis not present

## 2021-10-16 DIAGNOSIS — I25110 Atherosclerotic heart disease of native coronary artery with unstable angina pectoris: Secondary | ICD-10-CM

## 2021-10-16 DIAGNOSIS — E1122 Type 2 diabetes mellitus with diabetic chronic kidney disease: Secondary | ICD-10-CM | POA: Diagnosis not present

## 2021-10-16 DIAGNOSIS — I209 Angina pectoris, unspecified: Secondary | ICD-10-CM | POA: Diagnosis present

## 2021-10-16 HISTORY — PX: LEFT HEART CATH AND CORONARY ANGIOGRAPHY: CATH118249

## 2021-10-16 LAB — GLUCOSE, CAPILLARY: Glucose-Capillary: 166 mg/dL — ABNORMAL HIGH (ref 70–99)

## 2021-10-16 SURGERY — LEFT HEART CATH AND CORONARY ANGIOGRAPHY
Anesthesia: LOCAL

## 2021-10-16 MED ORDER — VERAPAMIL HCL 2.5 MG/ML IV SOLN
INTRAVENOUS | Status: DC | PRN
  Administered 2021-10-16: 10 mL via INTRA_ARTERIAL
  Administered 2021-10-16: 5 mL via INTRA_ARTERIAL
  Administered 2021-10-16: 10 mL via INTRA_ARTERIAL

## 2021-10-16 MED ORDER — SODIUM CHLORIDE 0.9 % IV SOLN: 250.0000 mL | INTRAVENOUS | Status: DC | PRN

## 2021-10-16 MED ORDER — MIDAZOLAM HCL 2 MG/2ML IJ SOLN
INTRAMUSCULAR | Status: AC
  Filled 2021-10-16: qty 2

## 2021-10-16 MED ORDER — SODIUM CHLORIDE 0.9% FLUSH: 3.0000 mL | INTRAVENOUS | Status: DC | PRN

## 2021-10-16 MED ORDER — HEPARIN (PORCINE) IN NACL 1000-0.9 UT/500ML-% IV SOLN
INTRAVENOUS | Status: AC
  Filled 2021-10-16: qty 1000

## 2021-10-16 MED ORDER — ONDANSETRON HCL 4 MG/2ML IJ SOLN: 4.0000 mg | Freq: Four times a day (QID) | INTRAMUSCULAR | Status: DC | PRN

## 2021-10-16 MED ORDER — ASPIRIN 81 MG PO CHEW: 81.0000 mg | CHEWABLE_TABLET | ORAL | Status: DC

## 2021-10-16 MED ORDER — SODIUM CHLORIDE 0.9 % WEIGHT BASED INFUSION: 1.0000 mL/kg/h | INTRAVENOUS | Status: DC

## 2021-10-16 MED ORDER — VERAPAMIL HCL 2.5 MG/ML IV SOLN
INTRAVENOUS | Status: AC
  Filled 2021-10-16: qty 2

## 2021-10-16 MED ORDER — SODIUM CHLORIDE 0.9 % WEIGHT BASED INFUSION
3.0000 mL/kg/h | INTRAVENOUS | Status: AC
  Administered 2021-10-16: 3 mL/kg/h via INTRAVENOUS

## 2021-10-16 MED ORDER — SODIUM CHLORIDE 0.9 % IV SOLN: INTRAVENOUS | Status: DC

## 2021-10-16 MED ORDER — LIDOCAINE HCL (PF) 1 % IJ SOLN
INTRAMUSCULAR | Status: AC
  Filled 2021-10-16: qty 30

## 2021-10-16 MED ORDER — ACETAMINOPHEN 325 MG PO TABS: 650.0000 mg | ORAL_TABLET | ORAL | Status: DC | PRN

## 2021-10-16 MED ORDER — LIDOCAINE HCL (PF) 1 % IJ SOLN
INTRAMUSCULAR | Status: DC | PRN
  Administered 2021-10-16: 2 mL

## 2021-10-16 MED ORDER — LABETALOL HCL 5 MG/ML IV SOLN: 10.0000 mg | INTRAVENOUS | Status: DC | PRN

## 2021-10-16 MED ORDER — HEPARIN (PORCINE) IN NACL 1000-0.9 UT/500ML-% IV SOLN
INTRAVENOUS | Status: DC | PRN
  Administered 2021-10-16 (×2): 500 mL

## 2021-10-16 MED ORDER — HEPARIN SODIUM (PORCINE) 1000 UNIT/ML IJ SOLN
INTRAMUSCULAR | Status: DC | PRN
  Administered 2021-10-16: 4500 [IU] via INTRAVENOUS

## 2021-10-16 MED ORDER — FENTANYL CITRATE (PF) 100 MCG/2ML IJ SOLN
INTRAMUSCULAR | Status: DC | PRN
  Administered 2021-10-16: 25 ug via INTRAVENOUS

## 2021-10-16 MED ORDER — HEPARIN SODIUM (PORCINE) 1000 UNIT/ML IJ SOLN
INTRAMUSCULAR | Status: AC
  Filled 2021-10-16: qty 10

## 2021-10-16 MED ORDER — MIDAZOLAM HCL 2 MG/2ML IJ SOLN
INTRAMUSCULAR | Status: DC | PRN
  Administered 2021-10-16: 2 mg via INTRAVENOUS

## 2021-10-16 MED ORDER — HYDRALAZINE HCL 20 MG/ML IJ SOLN: 10.0000 mg | INTRAMUSCULAR | Status: DC | PRN

## 2021-10-16 MED ORDER — IOHEXOL 350 MG/ML SOLN
INTRAVENOUS | Status: DC | PRN
  Administered 2021-10-16: 40 mL

## 2021-10-16 MED ORDER — SODIUM CHLORIDE 0.9% FLUSH: 3.0000 mL | Freq: Two times a day (BID) | INTRAVENOUS | Status: DC

## 2021-10-16 MED ORDER — FENTANYL CITRATE (PF) 100 MCG/2ML IJ SOLN
INTRAMUSCULAR | Status: AC
  Filled 2021-10-16: qty 2

## 2021-10-16 SURGICAL SUPPLY — 14 items
BAND CMPR LRG ZPHR (HEMOSTASIS) ×1
BAND ZEPHYR COMPRESS 30 LONG (HEMOSTASIS) IMPLANT
CATH INFINITI 4FR 145 PIGTAIL (CATHETERS) IMPLANT
CATH INFINITI JR4 5F (CATHETERS) IMPLANT
CATH OPTITORQUE TIG 4.0 5F (CATHETERS) IMPLANT
GLIDESHEATH SLEND SS 6F .021 (SHEATH) IMPLANT
GUIDEWIRE INQWIRE 1.5J.035X260 (WIRE) IMPLANT
INQWIRE 1.5J .035X260CM (WIRE) ×1
KIT HEART LEFT (KITS) ×1 IMPLANT
PACK CARDIAC CATHETERIZATION (CUSTOM PROCEDURE TRAY) ×1 IMPLANT
SYR MEDRAD MARK 7 150ML (SYRINGE) ×1 IMPLANT
TRANSDUCER W/STOPCOCK (MISCELLANEOUS) ×1 IMPLANT
TUBING CIL FLEX 10 FLL-RA (TUBING) ×1 IMPLANT
WIRE HI TORQ VERSACORE-J 145CM (WIRE) IMPLANT

## 2021-10-16 NOTE — Interval H&P Note (Signed)
Cath Lab Visit (complete for each Cath Lab visit)  Clinical Evaluation Leading to the Procedure:   ACS: No.  Non-ACS:    Anginal Classification: CCS II  Anti-ischemic medical therapy: Maximal Therapy (2 or more classes of medications)  Non-Invasive Test Results: No non-invasive testing performed  Prior CABG: No previous CABG      History and Physical Interval Note:  10/16/2021 1:11 PM  Joseph Wolf  has presented today for surgery, with the diagnosis of angina.  The various methods of treatment have been discussed with the patient and family. After consideration of risks, benefits and other options for treatment, the patient has consented to  Procedure(s): LEFT HEART CATH AND CORONARY ANGIOGRAPHY (N/A) as a surgical intervention.  The patient's history has been reviewed, patient examined, no change in status, stable for surgery.  I have reviewed the patient's chart and labs.  Questions were answered to the patient's satisfaction.     Shelva Majestic

## 2021-10-19 ENCOUNTER — Encounter (HOSPITAL_COMMUNITY): Payer: Self-pay | Admitting: Cardiovascular Disease

## 2021-11-25 ENCOUNTER — Ambulatory Visit: Payer: Medicare Other | Attending: Cardiology | Admitting: Cardiology

## 2021-11-25 ENCOUNTER — Encounter: Payer: Self-pay | Admitting: Cardiology

## 2021-11-25 VITALS — BP 150/80 | HR 74 | Ht 68.0 in | Wt 194.0 lb

## 2021-11-25 DIAGNOSIS — I2510 Atherosclerotic heart disease of native coronary artery without angina pectoris: Secondary | ICD-10-CM

## 2021-11-25 DIAGNOSIS — I10 Essential (primary) hypertension: Secondary | ICD-10-CM | POA: Diagnosis not present

## 2021-11-25 NOTE — Patient Instructions (Addendum)
Medication Instructions:  Your physician recommends that you continue on your current medications as directed. Please refer to the Current Medication list given to you today.  Labwork: none  Testing/Procedures: none  Follow-Up: Your physician recommends that you schedule a follow-up appointment in: 1 year. You will receive a reminder call in the mail in about 8 months reminding you to call and schedule your appointment. If you don't receive this call, please contact our office.  Any Other Special Instructions Will Be Listed Below (If Applicable).  If you need a refill on your cardiac medications before your next appointment, please call your pharmacy.

## 2021-11-25 NOTE — Progress Notes (Signed)
Cardiology Office Note  Date: 11/25/2021   ID: Mykeal, Carrick 1945/01/23, MRN [id number]  PCP:  Redmond School, MD  Cardiologist:  Rozann Lesches, MD Electrophysiologist:  None   Chief Complaint  Patient presents with   Cardiac follow-up    History of Present Illness: Joseph Wolf is a 76 y.o. male presenting for follow-up after recent cardiac catheterization by Dr. Claiborne Billings.  He was fortunately found to have only mild coronary atherosclerosis.  He is here today with his wife for a follow-up visit, overall reassured.  We have discussed continuing medical therapy for general risk factor modification.  He does admit that he is very active, cannot hold out like he did 20 years ago, we have discussed better pacing himself.  I reviewed his medications, no changes were made today.  Past Medical History:  Diagnosis Date   Anxiety    Aortic atherosclerosis (HCC)    BPH (benign prostatic hyperplasia)    Cervical spondylosis    CKD (chronic kidney disease) stage 3, GFR 30-59 ml/min (HCC)    Essential hypertension    Hypothyroidism    Mixed hyperlipidemia    Osteoarthritis    Type 2 diabetes mellitus ([place])     Past Surgical History:  Procedure Laterality Date   BACK SURGERY     2014. Has had four back surgeryes total   BIOPSY  12/23/2020   Procedure: BIOPSY;  Surgeon: Harvel Quale, MD;  [facility];  Service: Gastroenterology;;   COLONOSCOPY N/A 02/26/2015   Procedure: COLONOSCOPY;  Surgeon: Rogene Houston, MD;  [facility];  Service: Endoscopy;  Laterality: N/A;  1:00   COLONOSCOPY N/A 11/29/2019   Rehman: melanosis of colon, external/internal hemorrhoids, no specimens   ESOPHAGOGASTRODUODENOSCOPY (EGD) WITH PROPOFOL N/A 12/23/2020   Procedure: ESOPHAGOGASTRODUODENOSCOPY (EGD) WITH PROPOFOL;  Surgeon: Harvel Quale, MD;  [facility];  Service: Gastroenterology;  Laterality: N/A;  8:15   KNEE SURGERY     Left arthro    LEFT HEART CATH AND CORONARY ANGIOGRAPHY N/A 10/16/2021   Procedure: LEFT HEART CATH AND CORONARY ANGIOGRAPHY;  Surgeon: Troy Sine, MD;  Location: [place] [facility];  Service: Cardiovascular;  Laterality: N/A;    Current Outpatient Medications  Medication Sig Dispense Refill   acetaminophen (TYLENOL) 500 MG tablet Take 1,000 mg by mouth every 6 (six) hours as needed for mild pain or moderate pain.      ALPRAZolam (XANAX) 0.5 MG tablet Take 0.5 mg by mouth 3 (three) times daily as needed for anxiety.      amLODipine (NORVASC) 10 MG tablet Take 10 mg by mouth daily.     aspirin EC 81 MG tablet Take 1 tablet (81 mg total) by mouth daily. Swallow whole. 30 tablet 12   atenolol (TENORMIN) 50 MG tablet Take 50 mg by mouth daily.     cloNIDine (CATAPRES) 0.2 MG tablet Take 0.2 mg by mouth 2 (two) times daily.     empagliflozin (JARDIANCE) 10 MG TABS tablet Take 10 mg by mouth daily.     fluticasone (FLONASE) 50 MCG/ACT nasal spray Place 1 spray into both nostrils daily as needed for allergies or rhinitis.     gabapentin (NEURONTIN) 300 MG capsule Take 300 mg by mouth 3 (three) times daily.     Levothyroxine Sodium 25 MCG CAPS Take 25 mcg by mouth daily before breakfast.     linaclotide (LINZESS) 72 MCG capsule Take 1 capsule (72 mcg total) by mouth  daily before breakfast. (Patient taking differently: Take 72 mcg by mouth daily as needed (Constipation).) 30 capsule 3   losartan (COZAAR) 100 MG tablet Take 100 mg by mouth daily.     metFORMIN (GLUCOPHAGE) 850 MG tablet Take 850 mg by mouth 2 (two) times daily with a meal.     omeprazole (PRILOSEC) 20 MG capsule Take 20 mg by mouth daily.     polyethylene glycol (MIRALAX / GLYCOLAX) 17 g packet Take 17 g by mouth daily. 1 capful daily     rosuvastatin (CRESTOR) 20 MG tablet Take 20 mg by mouth at bedtime.      spironolactone (ALDACTONE) 25 MG tablet Take 12.5 mg by mouth daily.     traZODone (DESYREL) 100 MG tablet Take 100 mg by mouth at  bedtime.     Current Facility-Administered Medications  Medication Dose Route Frequency Provider Last Rate Last Admin   sodium chloride flush (NS) 0.9 % injection 3 mL  3 mL Intravenous Q12H Satira Sark, MD       Allergies:  Atorvastatin, Pravastatin, Ramipril, Simvastatin, Allegra [fexofenadine], and Keflex [cephalexin]   ROS: No palpitations or syncope.  Physical Exam: VS:  BP (!) 150/80   Pulse 74   Ht '5\' 8"'$  (1.727 m)   Wt 194 lb (88 kg)   SpO2 97%   BMI 29.50 kg/m , BMI Body mass index is 29.5 kg/m.  Wt Readings from Last 3 Encounters:  11/25/21 194 lb (88 kg)  10/16/21 192 lb (87.1 kg)  10/13/21 192 lb 6.4 oz (87.3 kg)    General: Patient appears comfortable at rest. HEENT: Conjunctiva and lids normal. Cardiac: Regular rate and rhythm, no S3, 1/6 systolic murmur. Extremities: No pitting edema, distal pulses 2+.  Right radial arteriotomy site well-healed.  ECG:  An ECG dated 10/12/2021 was personally reviewed today and demonstrated:  Sinus rhythm with right bundle branch block and nonspecific T wave abnormalities.  Recent Labwork: 10/12/2021: BUN 23; Creatinine, Ser 1.50; Hemoglobin 13.4; Platelets 238; Potassium 4.4; Sodium 135   Other Studies Reviewed Today:  Cardiac catheterization 10/16/2021:   Prox LAD lesion is 10% stenosed.   Mid Cx lesion is 10% stenosed.   No significant coronary obstructive disease with minimal plaque of approximately 10% in the proximal LAD inferiorly and mid circumflex vessel.   Tortuous dominant RCA.   RECOMMENDATION: Optimal blood pressure control.  Recommend outpatient 2D echo Doppler study for assessment of LV systolic and diastolic function.  Assessment and Plan:  1.  Dyspnea on exertion and chest pain now status postcardiac catheterization with very reassuring results, only minor coronary atherosclerosis.  Could have endothelial dysfunction as a contributor, but plan at this point is medical therapy and general risk factor  modification.  Continue aspirin, Norvasc, atenolol, Jardiance, Cozaar, and Crestor.  2.  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  3.  Mixed hyperlipidemia, tolerating Crestor with last LDL 58.  4.  Essential hypertension, on multimodal therapy.  Keep follow-up with PCP.  Medication Adjustments/Labs and Tests Ordered: Current medicines are reviewed at length with the patient today.  Concerns regarding medicines are outlined above.   Tests Ordered: No orders of the defined types were placed in this encounter.   Medication Changes: No orders of the defined types were placed in this encounter.   Disposition:  Follow up  1 year.  Signed, Satira Sark, MD, [facility] 11/25/2021 3:16 PM    [facility] at [facility] [address] [place], [place]  [place] Phone: [phone]; Fax: [phone]

## 2022-02-01 ENCOUNTER — Encounter (INDEPENDENT_AMBULATORY_CARE_PROVIDER_SITE_OTHER): Payer: Self-pay | Admitting: *Deleted

## 2022-03-26 LAB — COMPREHENSIVE METABOLIC PANEL WITH GFR: eGFR: 50

## 2022-06-24 ENCOUNTER — Encounter (INDEPENDENT_AMBULATORY_CARE_PROVIDER_SITE_OTHER): Payer: Self-pay | Admitting: Gastroenterology

## 2022-06-24 ENCOUNTER — Ambulatory Visit (INDEPENDENT_AMBULATORY_CARE_PROVIDER_SITE_OTHER): Payer: Medicare Other | Admitting: Gastroenterology

## 2022-06-24 VITALS — BP 122/73 | HR 81 | Temp 98.1°F | Ht 68.0 in | Wt 195.0 lb

## 2022-06-24 DIAGNOSIS — K5900 Constipation, unspecified: Secondary | ICD-10-CM

## 2022-06-24 DIAGNOSIS — K219 Gastro-esophageal reflux disease without esophagitis: Secondary | ICD-10-CM | POA: Diagnosis not present

## 2022-06-24 NOTE — Progress Notes (Addendum)
Referring Provider: Elfredia Nevins, MD Primary Care Physician:  Elfredia Nevins, MD Primary GI Physician: Levon Hedger   Chief Complaint  Patient presents with   Gastroesophageal Reflux    Follow up on GERD. Reports symptoms are better with med. Sometimes has break through symptoms if he eats something spicy.    Constipation    Follow up on constipation. Doing better since taking miralax one capful daily. Takes linzess 72 mcg if needed. Reports very seldom he has to take linzess. Has some pain on left side at times and gas.    HPI:   Joseph Wolf is a 77 y.o. male with past medical history of DM, high cholesterol, HTN, GERD.   Patient presenting today for follow up of GERD and constipation   Last seen June 2023, at that time continued on omeprazole 20mg  BID.  doing 1 capful of miralax per day, moving bowels atleast once per day with good results. Denies needing to strain. abdominal discomfort improved once his bowels started moving more regularly. Is drinking plenty of water. GERD has improved.Epigastric pain occurs very infrequently now. Has occasional heartburn, usually related to something he ate. will take an occasional tums if needed but this is rare. Feels that gas and bloating have improved as well.   Recommend continuing omeprazole 20mg  BID, continue miralax 1 capful daily.  Present:   patient reports he is doing pretty good. He has some occasional gas pain on the right side of his abdomen. Improved with passing flatulence. He takes tums on occasion which helps. He does not have to do this often. He is having some breakthrough heartburn depending on what he eats, maybe once or twice per week, takes tums when this occurs. Most episodes are around dinner times, notes he is only usually taking his PPI once a day in the morning. Denies nausea, vomiting, early satiety.   Constipation is well managed on miralax 1 capful per day. He is having a BM 1-2x/day. He notes that stools are a #6 on  miralax. He denies any fecal urgency or incontinence. No hard stools since starting starting miralax. No rectal bleeding or melena. No weight loss or changes in appetite.   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 - External and internal hemorrhoids. - No specimens collected. Last Endoscopy:12/23/20 2 cm hiatal hernia. - Three gastric polyps. Resected and retrieved. - Erythematous mucosa in the antrum. Biopsied- focal intestinal metaplasia, no h pylori Normal examined duodenum   Recommendations:  Repeat EGD in 3 years     Past Medical History:  Diagnosis Date   Anxiety    Aortic atherosclerosis (HCC)    BPH (benign prostatic hyperplasia)    Cervical spondylosis    CKD (chronic kidney disease) stage 3, GFR 30-59 ml/min (HCC)    Essential hypertension    Hypothyroidism    Mixed hyperlipidemia    Osteoarthritis    Type 2 diabetes mellitus (HCC)     Past Surgical History:  Procedure Laterality Date   BACK SURGERY     2014. Has had four back surgeryes total   BIOPSY  12/23/2020   Procedure: BIOPSY;  Surgeon: Dolores Frame, MD;  [facility];  Service: Gastroenterology;;   COLONOSCOPY N/A 02/26/2015   Procedure: COLONOSCOPY;  Surgeon: Malissa Hippo, MD;  [facility];  Service: Endoscopy;  Laterality: N/A;  1:00   COLONOSCOPY N/A 11/29/2019   Rehman: melanosis of colon, external/internal hemorrhoids, no specimens   ESOPHAGOGASTRODUODENOSCOPY (EGD) WITH PROPOFOL N/A 12/23/2020  Procedure: ESOPHAGOGASTRODUODENOSCOPY (EGD) WITH PROPOFOL;  Surgeon: Dolores Frame, MD;  [facility];  Service: Gastroenterology;  Laterality: N/A;  8:15   KNEE SURGERY     Left arthro   LEFT HEART CATH AND CORONARY ANGIOGRAPHY N/A 10/16/2021   Procedure: LEFT HEART CATH AND CORONARY ANGIOGRAPHY;  Surgeon: Lennette Bihari, MD;  [facility];  Service: Cardiovascular;  Laterality: N/A;    Current Outpatient Medications   Medication Sig Dispense Refill   acetaminophen (TYLENOL) 500 MG tablet Take 1,000 mg by mouth every 6 (six) hours as needed for mild pain or moderate pain.      ALPRAZolam (XANAX) 0.5 MG tablet Take 0.5 mg by mouth 3 (three) times daily as needed for anxiety.      amLODipine (NORVASC) 10 MG tablet Take 10 mg by mouth daily.     aspirin EC 81 MG tablet Take 1 tablet (81 mg total) by mouth daily. Swallow whole. 30 tablet 12   atenolol (TENORMIN) 50 MG tablet Take 50 mg by mouth daily.     cloNIDine (CATAPRES) 0.2 MG tablet Take 0.2 mg by mouth 2 (two) times daily.     empagliflozin (JARDIANCE) 10 MG TABS tablet Take 10 mg by mouth daily.     fluticasone (FLONASE) 50 MCG/ACT nasal spray Place 1 spray into both nostrils daily as needed for allergies or rhinitis.     gabapentin (NEURONTIN) 300 MG capsule Take 300 mg by mouth 3 (three) times daily.     Levothyroxine Sodium 25 MCG CAPS Take 25 mcg by mouth daily before breakfast.     linaclotide (LINZESS) 72 MCG capsule Take 1 capsule (72 mcg total) by mouth daily before breakfast. (Patient taking differently: Take 72 mcg by mouth daily as needed (Constipation).) 30 capsule 3   losartan (COZAAR) 100 MG tablet Take 100 mg by mouth daily.     metFORMIN (GLUCOPHAGE) 850 MG tablet Take 850 mg by mouth 2 (two) times daily with a meal.     omeprazole (PRILOSEC) 20 MG capsule Take 20 mg by mouth daily.     polyethylene glycol (MIRALAX / GLYCOLAX) 17 g packet Take 17 g by mouth daily. 1 capful daily     rosuvastatin (CRESTOR) 20 MG tablet Take 20 mg by mouth at bedtime.      spironolactone (ALDACTONE) 25 MG tablet Take 12.5 mg by mouth daily.     traZODone (DESYREL) 100 MG tablet Take 100 mg by mouth at bedtime.     Current Facility-Administered Medications  Medication Dose Route Frequency Provider Last Rate Last Admin   sodium chloride flush (NS) 0.9 % injection 3 mL  3 mL Intravenous Q12H Jonelle Sidle, MD        Allergies as of 06/24/2022 -  Review Complete 06/24/2022  Allergen Reaction Noted   Atorvastatin Other (See Comments) 01/05/2006   Pravastatin Other (See Comments) 01/05/2006   Ramipril Cough 01/05/2006   Simvastatin Other (See Comments) 01/05/2006   Allegra [fexofenadine] Swelling and Rash 01/22/2015   Keflex [cephalexin] Swelling and Rash 01/22/2015    Family History  Problem Relation Age of Onset   Stroke Mother    Hypertension Mother    Pancreatic cancer Father    Stroke Maternal Grandmother    Ulcerative colitis Paternal Grandmother     Social History   Socioeconomic History   Marital status: Married    Spouse name: Not on file   Number of children: Not on file   Years of  education: Not on file   Highest education level: Not on file  Occupational History   Not on file  Tobacco Use   Smoking status: Former    Types: Cigarettes    Passive exposure: Past   Smokeless tobacco: Never   Tobacco comments:    quit in 1990  Vaping Use   Vaping Use: Never used  Substance and Sexual Activity   Alcohol use: No    Alcohol/week: 0.0 standard drinks of alcohol   Drug use: No   Sexual activity: Not on file  Other Topics Concern   Not on file  Social History Narrative   Not on file   Social Determinants of Health   Financial Resource Strain: Not on file  Food Insecurity: Not on file  Transportation Needs: Not on file  Physical Activity: Not on file  Stress: Not on file  Social Connections: Not on file   Review of systems General: negative for malaise, night sweats, fever, chills, weight loss Neck: Negative for lumps, goiter, pain and significant neck swelling Resp: Negative for cough, wheezing, dyspnea at rest CV: Negative for chest pain, leg swelling, palpitations, orthopnea GI: denies melena, hematochezia, nausea, vomiting, diarrhea, constipation, dysphagia, odyonophagia, early satiety or unintentional weight loss. +gas pains/flatulence +Right sided abdominal discomfort +gerd symptoms  MSK:  Negative for joint pain or swelling, back pain, and muscle pain. Derm: Negative for itching or rash Psych: Denies depression, anxiety, memory loss, confusion. No homicidal or suicidal ideation.  Heme: Negative for prolonged bleeding, bruising easily, and swollen nodes. Endocrine: Negative for cold or heat intolerance, polyuria, polydipsia and goiter. Neuro: negative for tremor, gait imbalance, syncope and seizures. The remainder of the review of systems is noncontributory.  Physical Exam: BP 122/73 (BP Location: Left Arm, Patient Position: Sitting, Cuff Size: Normal)   Pulse 81   Temp 98.1 F (36.7 C) (Oral)   Ht 5\' 8"  (1.727 m)   Wt 195 lb (88.5 kg)   BMI 29.65 kg/m  General:   Alert and oriented. No distress noted. Pleasant and cooperative.  Head:  Normocephalic and atraumatic. Eyes:  Conjuctiva clear without scleral icterus. Mouth:  Oral mucosa pink and moist. Good dentition. No lesions. Heart: Normal rate and rhythm, s1 and s2 heart sounds present.  Lungs: Clear lung sounds in all lobes. Respirations equal and unlabored. Abdomen:  +BS, soft, non-tender and non-distended. No rebound or guarding. No HSM or masses noted. Derm: No palmar erythema or jaundice Msk:  Symmetrical without gross deformities. Normal posture. Extremities:  Without edema. Neurologic:  Alert and  oriented x4 Psych:  Alert and cooperative. Normal mood and affect.  Invalid input(s): "6 MONTHS"   ASSESSMENT: Joseph Wolf is a 77 y.o. male presenting today for follow up of GERD and Constipation   GERD: only taking omeprazole 20mg  once daily now, having some occasional right sided abdominal pain, improved with passing gas and some breakthrough heartburn 1-2x/week. Encouraged to increase PPI back to BID dosing to see if symptoms are better managed. He can take otc gas x for his flatulence to see if this helps. Should adhere to good reflux precautions and make me aware if symptoms fail to improve.   Constipation:  well managed on miralax 1 capful per day. He is having a BM 1-2x/day. stools are a #6 on miralax. Feels he is doing well on this regimen, will continue with miralax, good water intake, diet high in fruits, veggies, whole grains.   PLAN:  Increase omeprazole 20mg  back to BID  dosing  2. Reflux precautions  3. Continue with miralax 1 capful daily 4. Good water intake, diet high in fruits, veggies, whole grains, kiwi/prune especially 5. Gas x  All questions were answered, patient verbalized understanding and is in agreement with plan as outlined above.   Follow Up:  1 year   Capucine Tryon L. Jeanmarie Hubert, MSN, APRN, AGNP-C Adult-Gerontology Nurse Practitioner [facility]  I have reviewed the note and agree with the APP's assessment as described in this progress note  Katrinka Blazing, MD Gastroenterology and Hepatology [facility]

## 2022-06-24 NOTE — Patient Instructions (Addendum)
Increase omeprazole 20mg  back to twice a day, please let me know if this does not control your acid reflux symptoms  You can try over the counter gas x for your gas pains  Be mindful of greasy, spicy, fried, citrus foods, caffeine, carbonated drinks, chocolate and alcohol as these can increase reflux symptoms Stay upright 2-3 hours after eating, prior to lying down and avoid eating late in the evenings.  Continue with miralax 1 capful daily aim for atleast 64 oz per day of water  Try to get plenty of fruits, veggies and whole grains, in your diet  kiwi and prunes are especially good for constipation  Follow up 1 year

## 2022-12-08 ENCOUNTER — Ambulatory Visit: Payer: Medicare Other | Attending: Cardiology | Admitting: Cardiology

## 2022-12-08 ENCOUNTER — Encounter: Payer: Self-pay | Admitting: Cardiology

## 2022-12-08 VITALS — BP 128/68 | HR 81 | Ht 68.0 in | Wt 197.8 lb

## 2022-12-08 DIAGNOSIS — N1832 Chronic kidney disease, stage 3b: Secondary | ICD-10-CM | POA: Diagnosis not present

## 2022-12-08 DIAGNOSIS — I2510 Atherosclerotic heart disease of native coronary artery without angina pectoris: Secondary | ICD-10-CM

## 2022-12-08 DIAGNOSIS — I10 Essential (primary) hypertension: Secondary | ICD-10-CM

## 2022-12-08 DIAGNOSIS — E782 Mixed hyperlipidemia: Secondary | ICD-10-CM | POA: Diagnosis not present

## 2022-12-08 NOTE — Patient Instructions (Signed)
Medication Instructions:  Your physician recommends that you continue on your current medications as directed. Please refer to the Current Medication list given to you today.   Labwork: None today  Testing/Procedures: None today  Follow-Up: 1 year  Any Other Special Instructions Will Be Listed Below (If Applicable).  If you need a refill on your cardiac medications before your next appointment, please call your pharmacy.  

## 2022-12-08 NOTE — Progress Notes (Signed)
    Cardiology Office Note  Date: 12/08/2022   ID: [name], [name] 1945/05/22, MRN [id number]  History of Present Illness: Joseph Wolf is a 77 y.o. male last seen in November 2023.  He is here with his wife for a follow-up visit.  Reports no increasing dyspnea on exertion, no exertional chest pain.  Limited by lower back pain.  No palpitations or syncope.  I reviewed his medications.  Current cardiovascular regimen includes aspirin, Norvasc, atenolol, clonidine, Jardiance, Cozaar, Aldactone, and Crestor.  ECG today shows sinus rhythm with right bundle branch block and nonspecific ST-T wave abnormalities which are stable.  I rechecked his blood pressure today at 128/68 in the right arm.  He continues to follow at [facility].  Last LDL was 45 in August.  Physical Exam: VS:  BP 128/68 (BP Location: Right Arm)   Pulse 81   Ht 5\' 8"  (1.727 m)   Wt 197 lb 12.8 oz (89.7 kg)   SpO2 96%   BMI 30.08 kg/m , BMI Body mass index is 30.08 kg/m.  Wt Readings from Last 3 Encounters:  12/08/22 197 lb 12.8 oz (89.7 kg)  06/24/22 195 lb (88.5 kg)  11/25/21 194 lb (88 kg)    General: Patient appears comfortable at rest. HEENT: Conjunctiva and lids normal. Neck: Supple, no elevated JVP or carotid bruits. Lungs: Clear to auscultation, nonlabored breathing at rest. Cardiac: Regular rate and rhythm, no S3 or significant systolic murmur, no pericardial rub.  ECG:  An ECG dated 10/12/2021 was personally reviewed today and demonstrated:  Sinus rhythm with right bundle branch block, nonspecific ST-T wave abnormalities.  Labwork:  August 2024: Cholesterol 101, triglycerides 76, HDL 40, LDL 45 November 2024: Hemoglobin A1c 7.6%, BUN 18, creatinine 1.54, potassium 4.6, hemoglobin 13.9, platelets 239  Other Studies Reviewed Today:  No interval cardiac testing for review today.  Assessment and Plan:  1.  Mild nonobstructive CAD documented at cardiac catheterization in September 2023.  ECG reviewed  and stable.  No progressive exertional symptoms in the interim.  Continue medical therapy which includes aspirin, Jardiance and Crestor.  2.  Primary hypertension.  Continue regimen including Norvasc, atenolol, clonidine, Aldactone and Cozaar.  3.  Mixed hyperlipidemia.  Continue Crestor, LDL 45 in August of this year.  4.  CKD stage IIIb.  Follows with Dr. Wolfgang Phoenix.  Last creatinine 1.54.  Disposition:  Follow up  1 year.  Signed, Jonelle Sidle, M.D., F.A.C.C. [facility]

## 2023-05-05 LAB — HEMOGLOBIN A1C: Hemoglobin A1C: 7.5

## 2023-06-27 ENCOUNTER — Encounter (INDEPENDENT_AMBULATORY_CARE_PROVIDER_SITE_OTHER): Payer: Self-pay | Admitting: Gastroenterology

## 2023-06-27 ENCOUNTER — Ambulatory Visit (INDEPENDENT_AMBULATORY_CARE_PROVIDER_SITE_OTHER): Payer: Medicare Other | Admitting: Gastroenterology

## 2023-06-27 VITALS — BP 127/68 | HR 83 | Temp 97.4°F | Ht 68.0 in | Wt 197.0 lb

## 2023-06-27 DIAGNOSIS — K5900 Constipation, unspecified: Secondary | ICD-10-CM | POA: Diagnosis not present

## 2023-06-27 DIAGNOSIS — K219 Gastro-esophageal reflux disease without esophagitis: Secondary | ICD-10-CM | POA: Diagnosis not present

## 2023-06-27 NOTE — Progress Notes (Signed)
 Referring Provider: Kathyleen Parkins, MD Primary Care Physician:  Kathyleen Parkins, MD Primary GI Physician: Dr. Sammi Crick   Chief Complaint  Patient presents with   Follow-up    Pt arrives for follow up. No issues/concerns at this time.     HPI:   Joseph Wolf is a 78 y.o. male with past medical history of DM, high cholesterol, HTN, GERD   Patient presenting today for follow up of: GERD  Constipation  Last seen June 2024, at that time, having occasional gas pains on right side of abdomen, improved with passage of flatus. Taking tums on occasion. Taking PPI once daily. Constipation well managed on miralax 1 capful per day. 1-2 BMs per day.   Recommended to increase omeprazole 20mg  to BID, continue miralax 1 capful daily, gas x  Present:  Some occasional reflux, uses tums for this. Reports occasional breakthrough maybe every few months. He notes he is only taking prilosec once daily. Denies dysphagia or odynophagia. Appetite is good. Occasional nausea if he feels more constipated, but no vomiting.   Constipation mostly well managed with miralax 1 Capful per day. Usually having a BM every 1-2 days.  He has some occasional gas pains. Not taking anything for gas pain. No rectal bleeding or melena. He notes that stools have been more narrow, like the diameter of his pinky finger, cannot pinpoint when this began but notes it has been a while, he thinks a few years.     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 - External and internal hemorrhoids. - No specimens collected. Last Endoscopy:12/23/20 2 cm hiatal hernia. - Three gastric polyps. Resected and retrieved. - Erythematous mucosa in the antrum. Biopsied- focal intestinal metaplasia, no h pylori Normal examined duodenum   Recommendations:  Repeat EGD in 3 years    Filed Weights   06/27/23 1009  Weight: 197 lb (89.4 kg)     Past Medical History:  Diagnosis Date   Anxiety    Aortic atherosclerosis (HCC)    BPH  (benign prostatic hyperplasia)    Cervical spondylosis    CKD (chronic kidney disease) stage 3, GFR 30-59 ml/min (HCC)    Essential hypertension    Hypothyroidism    Mixed hyperlipidemia    Osteoarthritis    Type 2 diabetes mellitus (HCC)     Past Surgical History:  Procedure Laterality Date   BACK SURGERY     2014. Has had four back surgeryes total   BIOPSY  12/23/2020   Procedure: BIOPSY;  Surgeon: Urban Garden, MD;  [facility];  Service: Gastroenterology;;   COLONOSCOPY N/A 02/26/2015   Procedure: COLONOSCOPY;  Surgeon: Ruby Corporal, MD;  [facility]: [facility];  Service: Endoscopy;  Laterality: N/A;  1:00   COLONOSCOPY N/A 11/29/2019   Rehman: melanosis of colon, external/internal hemorrhoids, no specimens   ESOPHAGOGASTRODUODENOSCOPY (EGD) WITH PROPOFOL  N/A 12/23/2020   Procedure: ESOPHAGOGASTRODUODENOSCOPY (EGD) WITH PROPOFOL ;  Surgeon: Urban Garden, MD;  [facility]: [facility];  Service: Gastroenterology;  Laterality: N/A;  8:15   KNEE SURGERY     Left arthro   LEFT HEART CATH AND CORONARY ANGIOGRAPHY N/A 10/16/2021   Procedure: LEFT HEART CATH AND CORONARY ANGIOGRAPHY;  Surgeon: Millicent Ally, MD;  [facility]: [facility];  Service: Cardiovascular;  Laterality: N/A;    Current Outpatient Medications  Medication Sig Dispense Refill   acetaminophen  (TYLENOL ) 500 MG tablet Take 1,000 mg by mouth every 6 (six) hours as needed for mild pain or moderate  pain.      ALPRAZolam  (XANAX ) 0.5 MG tablet Take 0.5 mg by mouth 3 (three) times daily as needed for anxiety.      amLODipine  (NORVASC ) 10 MG tablet Take 10 mg by mouth daily.     aspirin  EC 81 MG tablet Take 1 tablet (81 mg total) by mouth daily. Swallow whole. 30 tablet 12   atenolol  (TENORMIN ) 50 MG tablet Take 50 mg by mouth daily.     cloNIDine  (CATAPRES ) 0.2 MG tablet Take 0.2 mg by mouth 2 (two) times daily.     fluticasone (FLONASE) 50 MCG/ACT nasal spray Place 1 spray into  both nostrils daily as needed for allergies or rhinitis.     gabapentin (NEURONTIN) 300 MG capsule Take 300 mg by mouth 3 (three) times daily.     Levothyroxine Sodium 25 MCG CAPS Take 25 mcg by mouth daily before breakfast.     linaclotide  (LINZESS ) 72 MCG capsule Take 72 mcg by mouth daily before breakfast. PRN per pt     losartan (COZAAR) 100 MG tablet Take 100 mg by mouth daily.     metFORMIN (GLUCOPHAGE) 850 MG tablet Take 850 mg by mouth 2 (two) times daily with a meal.     omeprazole (PRILOSEC) 20 MG capsule Take 20 mg by mouth daily.     polyethylene glycol (MIRALAX / GLYCOLAX) 17 g packet Take 17 g by mouth daily. 1 capful daily     rosuvastatin  (CRESTOR ) 20 MG tablet Take 20 mg by mouth at bedtime.      spironolactone (ALDACTONE) 25 MG tablet Take 12.5 mg by mouth daily.     traZODone (DESYREL) 100 MG tablet Take 100 mg by mouth at bedtime.     empagliflozin (JARDIANCE) 10 MG TABS tablet Take 10 mg by mouth daily. (Patient not taking: Reported on 06/27/2023)     Current Facility-Administered Medications  Medication Dose Route Frequency Provider Last Rate Last Admin   sodium chloride  flush (NS) 0.9 % injection 3 mL  3 mL Intravenous Q12H Gerard Knight, MD        Allergies as of 06/27/2023 - Review Complete 06/27/2023  Allergen Reaction Noted   Atorvastatin Other (See Comments) 01/05/2006   Pravastatin Other (See Comments) 01/05/2006   Ramipril Cough 01/05/2006   Simvastatin Other (See Comments) 01/05/2006   Allegra [fexofenadine] Swelling and Rash 01/22/2015   Keflex [cephalexin] Swelling and Rash 01/22/2015    Social History   Socioeconomic History   Marital status: Married    Spouse name: Not on file   Number of children: Not on file   Years of education: Not on file   Highest education level: Not on file  Occupational History   Not on file  Tobacco Use   Smoking status: Former    Types: Cigarettes    Passive exposure: Past   Smokeless tobacco: Never   Tobacco  comments:    quit in 1990  Vaping Use   Vaping status: Never Used  Substance and Sexual Activity   Alcohol use: No    Alcohol/week: 0.0 standard drinks of alcohol   Drug use: No   Sexual activity: Not on file  Other Topics Concern   Not on file  Social History Narrative   Not on file   Social Drivers of Health   Financial Resource Strain: Not on file  Food Insecurity: Not on file  Transportation Needs: Not on file  Physical Activity: Not on file  Stress: Not on file  Social Connections:  Not on file    Review of systems General: negative for malaise, night sweats, fever, chills, weight loss Neck: Negative for lumps, goiter, pain and significant neck swelling Resp: Negative for cough, wheezing, dyspnea at rest CV: Negative for chest pain, leg swelling, palpitations, orthopnea GI: denies melena, hematochezia, nausea, vomiting, diarrhea, constipation, dysphagia, odyonophagia, early satiety or unintentional weight loss. +occasional breakthrough reflux  The remainder of the review of systems is noncontributory.  Physical Exam: BP 127/68   Pulse 83   Temp (!) 97.4 F (36.3 C)   Ht 5\' 8"  (1.727 m)   Wt 197 lb (89.4 kg)   BMI 29.95 kg/m  General:   Alert and oriented. No distress noted. Pleasant and cooperative.  Head:  Normocephalic and atraumatic. Eyes:  Conjuctiva clear without scleral icterus. Mouth:  Oral mucosa pink and moist. Good dentition. No lesions. Heart: Normal rate and rhythm, s1 and s2 heart sounds present.  Lungs: Clear lung sounds in all lobes. Respirations equal and unlabored. Abdomen:  +BS, soft, non-tender and non-distended. No rebound or guarding. No HSM or masses noted. Neurologic:  Alert and  oriented x4 Psych:  Alert and cooperative. Normal mood and affect.  Invalid input(s): "6 MONTHS"   ASSESSMENT: MARQUS MACPHEE is a 78 y.o. male presenting today for follow up of constipation and GERD  Constipation: well managed on miralax 1 capful daily. No  rectal bleeding, melena. He reports more narrow caliber stools though notably per chart review this has been going on since prior to his last TCS in 2021, as outlined above, therefore no recommendations for endoscopic evaluation at this time. Will continue with high fiber diet, good water  intake, miralax daily.   GERD: well controlled on omeprazole 20mg  daily, he did not increase to BID dosing after last visit though notes breakthrough symptoms only every few months. For now will continue with once daily dosing of PPI and good reflux precautions   He is due for repeat EGD in December due to findings of intestinal metaplasia in the stomach, on last EGD. Will ensure he is on the recall list and reach out closer to due time to get him scheduled.    PLAN:  -continue omeprazole 20mg  daily -Increase water  intake, aim for atleast 64 oz per day Increase fruits, veggies and whole grains, kiwi and prunes are especially good for constipation -continue miralax 1 capful daily -Avoid greasy, spicy, fried, citrus foods, and be mindful that caffeine, carbonated drinks, chocolate and alcohol can increase reflux symptoms Stay upright 2-3 hours after eating, prior to lying down and avoid eating late in the evenings. 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   Follow Up: 1 year   Daniel Ritthaler L. [place] Vanaken, MSN, APRN, AGNP-C Adult-Gerontology Nurse Practitioner [facility]

## 2023-06-27 NOTE — Patient Instructions (Signed)
-  continue omeprazole 20mg  daily -Increase water  intake, aim for atleast 64 oz per day Increase fruits, veggies and whole grains, kiwi and prunes are especially good for constipation -continue miralax 1 capful daily -Avoid greasy, spicy, fried, citrus foods, and be mindful that caffeine, carbonated drinks, chocolate and alcohol can increase reflux symptoms Stay upright 2-3 hours after eating, prior to lying down and avoid eating late in the evenings.  You are due for EGD in December, we will reach out to you closer to time to schedule this  Follow up 1 year

## 2023-06-28 ENCOUNTER — Telehealth: Payer: Self-pay | Admitting: Internal Medicine

## 2023-06-28 NOTE — Telephone Encounter (Signed)
 Please advise with provider   Copied from CRM [id number]. Topic: General - Other >> Jun 28, 2023  8:47 AM Emylou G wrote: Reason for CRM: Patiens wife called.. wants to know if patient can be a new patient for Dr Lydia Sams since she is a patient with him???

## 2023-06-29 NOTE — Telephone Encounter (Signed)
 Appt scheduled , patient/ spouse aware

## 2023-08-25 ENCOUNTER — Encounter: Payer: Self-pay | Admitting: Internal Medicine

## 2023-08-25 ENCOUNTER — Ambulatory Visit: Admitting: Internal Medicine

## 2023-08-25 VITALS — BP 134/76 | HR 80 | Ht 68.0 in | Wt 197.2 lb

## 2023-08-25 DIAGNOSIS — F411 Generalized anxiety disorder: Secondary | ICD-10-CM

## 2023-08-25 DIAGNOSIS — I10 Essential (primary) hypertension: Secondary | ICD-10-CM | POA: Diagnosis not present

## 2023-08-25 DIAGNOSIS — N1832 Chronic kidney disease, stage 3b: Secondary | ICD-10-CM | POA: Diagnosis not present

## 2023-08-25 DIAGNOSIS — E1169 Type 2 diabetes mellitus with other specified complication: Secondary | ICD-10-CM

## 2023-08-25 DIAGNOSIS — I2510 Atherosclerotic heart disease of native coronary artery without angina pectoris: Secondary | ICD-10-CM

## 2023-08-25 DIAGNOSIS — K219 Gastro-esophageal reflux disease without esophagitis: Secondary | ICD-10-CM

## 2023-08-25 DIAGNOSIS — N189 Chronic kidney disease, unspecified: Secondary | ICD-10-CM | POA: Insufficient documentation

## 2023-08-25 DIAGNOSIS — Z7984 Long term (current) use of oral hypoglycemic drugs: Secondary | ICD-10-CM

## 2023-08-25 DIAGNOSIS — E538 Deficiency of other specified B group vitamins: Secondary | ICD-10-CM

## 2023-08-25 DIAGNOSIS — H66001 Acute suppurative otitis media without spontaneous rupture of ear drum, right ear: Secondary | ICD-10-CM

## 2023-08-25 DIAGNOSIS — K5904 Chronic idiopathic constipation: Secondary | ICD-10-CM

## 2023-08-25 DIAGNOSIS — J309 Allergic rhinitis, unspecified: Secondary | ICD-10-CM

## 2023-08-25 MED ORDER — CYANOCOBALAMIN 1000 MCG/ML IJ SOLN
1000.0000 ug | Freq: Once | INTRAMUSCULAR | Status: AC
  Administered 2023-08-25: 1000 ug via INTRAMUSCULAR

## 2023-08-25 MED ORDER — OFLOXACIN 0.3 % OT SOLN: 5.0000 [drp] | Freq: Every day | OTIC | 0 refills | Status: DC

## 2023-08-25 NOTE — Assessment & Plan Note (Signed)
 Well-controlled with Xanax  0.5 mg 3 times daily as needed Has been on Xanax  for many years, reviewed PDMP - agreed to refill when needed

## 2023-08-25 NOTE — Assessment & Plan Note (Signed)
 Currently denies chest pain Followed by cardiology On amlodipine , atenolol , losartan and spironolactone

## 2023-08-25 NOTE — Assessment & Plan Note (Signed)
 Right ear fullness and mild irritation likely due to otitis media Ofloxacin  eardrops prescribed Advised to avoid any sharp objects for cleaning purposes

## 2023-08-25 NOTE — Assessment & Plan Note (Addendum)
 BP Readings from Last 1 Encounters:  08/25/23 134/76   Well-controlled at home with amlodipine  10 mg QD, atenolol  50 mg QD, clonidine  0.2 mg BID, losartan 100 mg QD and spironolactone 12.5 mg QD Counseled for compliance with the medications Advised DASH diet and moderate exercise/walking as tolerated

## 2023-08-25 NOTE — Assessment & Plan Note (Addendum)
 Well-controlled with Linzess  Also has MiraLAX as needed Advised to maintain adequate hydration Followed by GI

## 2023-08-25 NOTE — Assessment & Plan Note (Signed)
 Last BMP reviewed, GFR stays around 35-45 Followed by nephrology-Dr. Rachele Maintain adequate hydration On losartan currently Check urine microalbumin/creatinine ratio

## 2023-08-25 NOTE — Assessment & Plan Note (Signed)
 Advised to continue using Flonase regularly Advised to use sinus inhaler/vaporizer for nasal congestion

## 2023-08-25 NOTE — Assessment & Plan Note (Signed)
 Gets B12 injections monthly B12 injection today

## 2023-08-25 NOTE — Assessment & Plan Note (Addendum)
 Lab Results  Component Value Date   HGBA1C 7.5 05/05/2023   Overall well-controlled for his age Associated with HTN, HLD, CAD and neuropathy On metformin 850 mg twice daily Advised to restart taking Jardiance 12.5 mg (half tablet of 25 mg) Advised to follow diabetic diet On statin and ARB F/u CMP and lipid panel - gets blood tests at [facility] Diabetic eye exam: Advised to follow up with Ophthalmology for diabetic eye exam  Takes gabapentin 300 mg 3 times daily for neuropathy

## 2023-08-25 NOTE — Assessment & Plan Note (Signed)
 Well controlled with omeprazole  20 mg QD

## 2023-08-25 NOTE — Patient Instructions (Signed)
 Please start taking half tablet of Jardiance 25 mg once daily.  Please continue to take other medications as prescribed.  Please continue to follow low carb diet and perform moderate exercise/walking as tolerated.

## 2023-08-25 NOTE — Progress Notes (Signed)
 New Patient Office Visit  Subjective:  Patient ID: Joseph Wolf, male    DOB: Oct 27, 1945  Age: 78 y.o. MRN: [id number]  CC:  Chief Complaint  Patient presents with   Establish Care    New patient establishing care.    Ear Fullness    Reports fluid in right ear.     HPI Joseph Wolf is a 78 y.o. male with past medical history of HTN, CAD, GERD, type II DM, CKD, GAD and allergic rhinitis who presents for establishing care.  HTN: His BP was borderline elevated today, but reports home BP in 120s-130s/70s.  He takes amlodipine  10 mg QD, atenolol  50 mg QD, clonidine  0.2 mg twice daily, losartan 100 mg QD and spironolactone 12.5 mg QD.  He is followed by cardiology, nephrology and [facility] as well.  Denies any headache, dizziness, chest pain, dyspnea or palpitations currently.  Type II DM: He takes metformin 850 mg twice daily currently.  He was taking Jardiance in the past, but has stopped taking it recently as he thought he had rash due to it.  Of note, he had taken Jardiance for about a year before stopping it.  He checks blood glucose regularly, and reports them being around 120s-140s in the morning.  Denies polyuria or polydipsia currently.  CKD: Followed by Dr. Rachele.  Denies dysuria, hematuria, urinary hesitancy or resistance.  GAD: He takes Xanax  0.5 mg 3 times daily as needed for a long time.  Denies anhedonia, panic episodes, SI or HI currently.  Allergic rhinitis: He reports chronic nasal congestion, and has Flonase for it.  He also reports right ear irritation for the last 2 weeks, which has been recurrent in the past.  He reports remote baseball injury to the right ear.  He has pain behind the right ear currently, but denies ear discharge.  Denies fever or chills.  Past Medical History:  Diagnosis Date   Anxiety    Aortic atherosclerosis (HCC)    BPH (benign prostatic hyperplasia)    Cervical spondylosis    CKD (chronic kidney disease) stage 3, GFR 30-59 ml/min (HCC)     Essential hypertension    Hypothyroidism    Mixed hyperlipidemia    Osteoarthritis    Type 2 diabetes mellitus (HCC)     Past Surgical History:  Procedure Laterality Date   BACK SURGERY     2014. Has had four back surgeryes total   BIOPSY  12/23/2020   Procedure: BIOPSY;  Surgeon: Eartha Angelia Sieving, MD;  [facility];  Service: Gastroenterology;;   COLONOSCOPY N/A 02/26/2015   Procedure: COLONOSCOPY;  Surgeon: Claudis RAYMOND Rivet, MD;  [facility];  Service: Endoscopy;  Laterality: N/A;  1:00   COLONOSCOPY N/A 11/29/2019   Rehman: melanosis of colon, external/internal hemorrhoids, no specimens   ESOPHAGOGASTRODUODENOSCOPY (EGD) WITH PROPOFOL  N/A 12/23/2020   Procedure: ESOPHAGOGASTRODUODENOSCOPY (EGD) WITH PROPOFOL ;  Surgeon: Eartha Angelia Sieving, MD;  [facility];  Service: Gastroenterology;  Laterality: N/A;  8:15   KNEE SURGERY     Left arthro   LEFT HEART CATH AND CORONARY ANGIOGRAPHY N/A 10/16/2021   Procedure: LEFT HEART CATH AND CORONARY ANGIOGRAPHY;  Surgeon: Burnard Debby LABOR, MD;  [facility];  Service: Cardiovascular;  Laterality: N/A;    Family History  Problem Relation Age of Onset   Stroke Mother    Hypertension Mother    Pancreatic cancer Father    Stroke Maternal Grandmother  Ulcerative colitis Paternal Grandmother     Social History   Socioeconomic History   Marital status: Married    Spouse name: Not on file   Number of children: Not on file   Years of education: Not on file   Highest education level: Not on file  Occupational History   Not on file  Tobacco Use   Smoking status: Former    Types: Cigarettes    Passive exposure: Past   Smokeless tobacco: Never   Tobacco comments:    quit in 1990  Vaping Use   Vaping status: Never Used  Substance and Sexual Activity   Alcohol use: No    Alcohol/week: 0.0 standard drinks of alcohol   Drug use: No   Sexual activity: Not on file  Other  Topics Concern   Not on file  Social History Narrative   Not on file   Social Drivers of Health   Financial Resource Strain: Not on file  Food Insecurity: Not on file  Transportation Needs: Not on file  Physical Activity: Not on file  Stress: Not on file  Social Connections: Not on file  Intimate Partner Violence: Not on file    ROS Review of Systems  Constitutional:  Negative for chills and fever.  HENT:  Positive for congestion and ear pain (Right). Negative for sore throat.   Eyes:  Negative for pain and discharge.  Respiratory:  Negative for cough and shortness of breath.   Cardiovascular:  Negative for chest pain and palpitations.  Gastrointestinal:  Positive for constipation. Negative for diarrhea, nausea and vomiting.  Endocrine: Negative for polydipsia and polyuria.  Genitourinary:  Negative for dysuria and hematuria.  Musculoskeletal:  Negative for neck pain and neck stiffness.  Skin:  Negative for rash.  Neurological:  Negative for dizziness, weakness, numbness and headaches.  Psychiatric/Behavioral:  Negative for agitation and behavioral problems. The patient is nervous/anxious.     Objective:   Today's Vitals: BP 134/76 (BP Location: Left Arm) Comment: Home reading  Pulse 80   Ht 5' 8 (1.727 m)   Wt 197 lb 3.2 oz (89.4 kg)   SpO2 98%   BMI 29.98 kg/m   Physical Exam Vitals reviewed.  Constitutional:      General: He is not in acute distress.    Appearance: He is not diaphoretic.  HENT:     Head: Normocephalic and atraumatic.     Right Ear: A middle ear effusion is present.     Left Ear: Ear canal and external ear normal.     Nose: Nose normal.     Mouth/Throat:     Mouth: Mucous membranes are moist.  Eyes:     General: No scleral icterus.    Extraocular Movements: Extraocular movements intact.  Cardiovascular:     Rate and Rhythm: Normal rate and regular rhythm.     Heart sounds: Normal heart sounds. No murmur heard. Pulmonary:     Breath  sounds: Normal breath sounds. No wheezing or rales.  Musculoskeletal:     Cervical back: Neck supple. No tenderness.     Right lower leg: No edema.     Left lower leg: No edema.  Skin:    General: Skin is warm.     Findings: No rash.  Neurological:     General: No focal deficit present.     Mental Status: He is alert and oriented to person, place, and time.  Psychiatric:        Mood and Affect: Mood  normal.        Behavior: Behavior normal.     Assessment & Plan:   Problem List Items Addressed This Visit       Cardiovascular and Mediastinum   Essential hypertension   BP Readings from Last 1 Encounters:  08/25/23 134/76   Well-controlled at home with amlodipine  10 mg QD, atenolol  50 mg QD, clonidine  0.2 mg BID, losartan 100 mg QD and spironolactone 12.5 mg QD Counseled for compliance with the medications Advised DASH diet and moderate exercise/walking as tolerated      CAD (coronary artery disease)   Currently denies chest pain Followed by cardiology On amlodipine , atenolol , losartan and spironolactone        Respiratory   Allergic rhinitis   Advised to continue using Flonase regularly Advised to use sinus inhaler/vaporizer for nasal congestion        Digestive   Gastroesophageal reflux disease   Well controlled with omeprazole 20 mg QD      Chronic idiopathic constipation   Well-controlled with Linzess  Also has MiraLAX as needed Advised to maintain adequate hydration Followed by GI        Endocrine   Type 2 diabetes mellitus with other specified complication (HCC) - Primary   Lab Results  Component Value Date   HGBA1C 7.5 05/05/2023   Overall well-controlled for his age Associated with HTN, HLD, CAD and neuropathy On metformin 850 mg twice daily Advised to restart taking Jardiance 12.5 mg (half tablet of 25 mg) Advised to follow diabetic diet On statin and ARB F/u CMP and lipid panel - gets blood tests at [facility] Diabetic eye exam: Advised to  follow up with Ophthalmology for diabetic eye exam  Takes gabapentin 300 mg 3 times daily for neuropathy      Relevant Orders   Microalbumin / creatinine urine ratio     Nervous and Auditory   Non-recurrent acute suppurative otitis media of right ear without spontaneous rupture of tympanic membrane   Right ear fullness and mild irritation likely due to otitis media Ofloxacin  eardrops prescribed Advised to avoid any sharp objects for cleaning purposes      Relevant Medications   ofloxacin  (FLOXIN ) 0.3 % OTIC solution     Genitourinary   CKD (chronic kidney disease), stage 3b   Last BMP reviewed, GFR stays around 35-45 Followed by nephrology-Dr. Rachele Maintain adequate hydration On losartan currently Check urine microalbumin/creatinine ratio         Other   GAD (generalized anxiety disorder)   Well-controlled with Xanax  0.5 mg 3 times daily as needed Has been on Xanax  for many years, reviewed PDMP - agreed to refill when needed      B12 deficiency   Gets B12 injections monthly B12 injection today       Outpatient Encounter Medications as of 08/25/2023  Medication Sig   acetaminophen  (TYLENOL ) 500 MG tablet Take 1,000 mg by mouth every 6 (six) hours as needed for mild pain or moderate pain.    ALPRAZolam  (XANAX ) 0.5 MG tablet Take 0.5 mg by mouth 3 (three) times daily as needed for anxiety.    amLODipine  (NORVASC ) 10 MG tablet Take 10 mg by mouth daily.   aspirin  EC 81 MG tablet Take 1 tablet (81 mg total) by mouth daily. Swallow whole.   atenolol  (TENORMIN ) 50 MG tablet Take 50 mg by mouth daily.   cloNIDine  (CATAPRES ) 0.2 MG tablet Take 0.2 mg by mouth 2 (two) times daily.   empagliflozin (JARDIANCE) 10 MG  TABS tablet Take 10 mg by mouth daily.   fluticasone (FLONASE) 50 MCG/ACT nasal spray Place 1 spray into both nostrils daily as needed for allergies or rhinitis.   gabapentin (NEURONTIN) 300 MG capsule Take 300 mg by mouth 3 (three) times daily.   Levothyroxine  Sodium 25 MCG CAPS Take 25 mcg by mouth daily before breakfast.   linaclotide  (LINZESS ) 72 MCG capsule Take 72 mcg by mouth daily before breakfast. PRN per pt   losartan (COZAAR) 100 MG tablet Take 100 mg by mouth daily.   metFORMIN (GLUCOPHAGE) 850 MG tablet Take 850 mg by mouth 2 (two) times daily with a meal.   ofloxacin  (FLOXIN ) 0.3 % OTIC solution Place 5 drops into the right ear daily.   omeprazole (PRILOSEC) 20 MG capsule Take 20 mg by mouth daily.   polyethylene glycol (MIRALAX / GLYCOLAX) 17 g packet Take 17 g by mouth daily. 1 capful daily   rosuvastatin  (CRESTOR ) 20 MG tablet Take 20 mg by mouth at bedtime.    spironolactone (ALDACTONE) 25 MG tablet Take 12.5 mg by mouth daily.   traZODone (DESYREL) 100 MG tablet Take 100 mg by mouth at bedtime.   Facility-Administered Encounter Medications as of 08/25/2023  Medication   [COMPLETED] cyanocobalamin  (VITAMIN B12) injection 1,000 mcg   sodium chloride  flush (NS) 0.9 % injection 3 mL    Follow-up: Return in about 4 months (around 12/25/2023) for HTN and GAD.   Suzzane MARLA Blanch, MD

## 2023-08-27 LAB — MICROALBUMIN / CREATININE URINE RATIO
Creatinine, Urine: 29.7 mg/dL
Microalb/Creat Ratio: 466 mg/g{creat} — ABNORMAL HIGH (ref 0–29)
Microalbumin, Urine: 138.3 ug/mL

## 2023-09-21 ENCOUNTER — Ambulatory Visit

## 2023-09-21 LAB — BASIC METABOLIC PANEL WITH GFR: Creatinine: 1.6 — AB (ref 0.6–1.3)

## 2023-09-23 ENCOUNTER — Ambulatory Visit (INDEPENDENT_AMBULATORY_CARE_PROVIDER_SITE_OTHER)

## 2023-09-23 DIAGNOSIS — E538 Deficiency of other specified B group vitamins: Secondary | ICD-10-CM

## 2023-09-23 LAB — HEMOGLOBIN A1C: Hemoglobin A1C: 8.1

## 2023-09-23 MED ORDER — CYANOCOBALAMIN 1000 MCG/ML IJ SOLN
1000.0000 ug | Freq: Once | INTRAMUSCULAR | Status: AC
  Administered 2023-09-23: 1000 ug via INTRAMUSCULAR

## 2023-09-23 NOTE — Progress Notes (Signed)
 Patient is in office today for a nurse visit for B12 Injection. Patient Injection was given in the  Right deltoid. Patient tolerated injection well.

## 2023-10-24 ENCOUNTER — Ambulatory Visit

## 2023-10-24 ENCOUNTER — Telehealth: Payer: Self-pay

## 2023-10-24 ENCOUNTER — Other Ambulatory Visit: Payer: Self-pay | Admitting: Internal Medicine

## 2023-10-24 ENCOUNTER — Ambulatory Visit (INDEPENDENT_AMBULATORY_CARE_PROVIDER_SITE_OTHER)

## 2023-10-24 DIAGNOSIS — E538 Deficiency of other specified B group vitamins: Secondary | ICD-10-CM

## 2023-10-24 DIAGNOSIS — Z23 Encounter for immunization: Secondary | ICD-10-CM | POA: Diagnosis not present

## 2023-10-24 DIAGNOSIS — H66001 Acute suppurative otitis media without spontaneous rupture of ear drum, right ear: Secondary | ICD-10-CM

## 2023-10-24 MED ORDER — CYANOCOBALAMIN 1000 MCG/ML IJ SOLN
1000.0000 ug | Freq: Once | INTRAMUSCULAR | Status: AC
  Administered 2023-10-24: 1000 ug via INTRAMUSCULAR

## 2023-10-24 MED ORDER — OFLOXACIN 0.3 % OT SOLN: 5.0000 [drp] | Freq: Every day | OTIC | 0 refills | Status: DC

## 2023-10-24 NOTE — Progress Notes (Signed)
 Patient is in office today for a nurse visit for B12 Injection. Patient Injection was given in the  Left deltoid. Patient tolerated injection well. Flu shot given in right arm

## 2023-10-24 NOTE — Telephone Encounter (Signed)
 Spoke to pt, verbalized understanding

## 2023-10-24 NOTE — Telephone Encounter (Signed)
 Prescription Request  10/24/2023  LOV: Visit date not found  What is the name of the medication or equipment?   ofloxacin  (FLOXIN ) 0.3 % OTIC solution    Have you contacted your pharmacy to request a refill? Yes   Which pharmacy would you like this sent to?  [id number] - , [place] - [address] AT [facility] [address]  [place] [address] Phone: [phone] Fax: [phone]    Patient notified that their request is being sent to the clinical staff for review and that they should receive a response within 2 business days.   Please advise at Mobile [phone] (mobile)

## 2023-11-03 ENCOUNTER — Ambulatory Visit (INDEPENDENT_AMBULATORY_CARE_PROVIDER_SITE_OTHER)

## 2023-11-03 VITALS — BP 118/60 | Ht 68.5 in | Wt 197.0 lb

## 2023-11-03 DIAGNOSIS — Z0000 Encounter for general adult medical examination without abnormal findings: Secondary | ICD-10-CM | POA: Diagnosis not present

## 2023-11-03 NOTE — Patient Instructions (Addendum)
 Mr. Gilman, Olazabal are due for a Tetanus vaccine and Covid booster. [facility]. I will request your last eye exam report from the [facility] as well.    Thank you for taking the time for your Medicare Wellness Visit. I appreciate your continued commitment to your health goals. Please review the care plan we discussed, and feel free to reach out if I can assist you further.  Medicare recommends these wellness visits once per year to help you and your care team stay ahead of potential health issues. These visits are designed to focus on prevention, allowing your provider to concentrate on managing your acute and chronic conditions during your regular appointments.  Please note that Annual Wellness Visits do not include a physical exam. Some assessments may be limited, especially if the visit was conducted virtually. If needed, we may recommend a separate in-person follow-up with your provider.  Wishing you excellent health and many blessings in the year to come!  -Deyona Soza, CMA  Ongoing Care Seeing your primary care provider every 3 to 6 months helps us  monitor your health and provide consistent, personalized care.   Recommended Screenings:  Health Maintenance  Topic Date Due   Eye exam for diabetics  Never done   DTaP/Tdap/Td vaccine (2 - Td or Tdap) 08/07/2019   Yearly kidney function blood test for diabetes  10/13/2022   COVID-19 Vaccine (11 - 2025-26 season) 09/19/2023   Hemoglobin A1C  11/04/2023   Yearly kidney health urinalysis for diabetes  08/24/2024   Complete foot exam   08/24/2024   Medicare Annual Wellness Visit  11/02/2024   Pneumococcal Vaccine for age over 59  Completed   Flu Shot  Completed   Hepatitis C Screening  Completed   Zoster (Shingles) Vaccine  Completed   Meningitis B Vaccine  Aged Out   Colon Cancer Screening  Discontinued       11/03/2023    3:08 PM  Advanced Directives  Does Patient Have a Medical Advance Directive? No  Would  patient like information on creating a medical advance directive? Yes (MAU/Ambulatory/Procedural Areas - Information given)   Advance Care Planning is important because it: Ensures you receive medical care that aligns with your values, goals, and preferences. Provides guidance to your family and loved ones, reducing the emotional burden of decision-making during critical moments.  Vision: Annual vision screenings are recommended for early detection of glaucoma, cataracts, and diabetic retinopathy. These exams can also reveal signs of chronic conditions such as diabetes and high blood pressure.  Dental: Annual dental screenings help detect early signs of oral cancer, gum disease, and other conditions linked to overall health, including heart disease and diabetes.  Please see the attached documents for additional preventive care recommendations.

## 2023-11-03 NOTE — Progress Notes (Signed)
 Subjective:   Joseph Wolf is a 78 y.o. who presents for a Medicare Wellness preventive visit.  As a reminder, Annual Wellness Visits don't include a physical exam, and some assessments may be limited, especially if this visit is performed virtually. We may recommend an in-person follow-up visit with your provider if needed.  Visit Complete: Virtual I connected with  Joseph Wolf on 11/03/23 by a audio enabled telemedicine application and verified that I am speaking with the correct person using two identifiers.  Patient Location: Home  Provider Location: Office/Clinic  I discussed the limitations of evaluation and management by telemedicine. The patient expressed understanding and agreed to proceed.  Vital Signs: Because this visit was a virtual/telehealth visit, some criteria may be missing or patient reported. Any vitals not documented were not able to be obtained and vitals that have been documented are patient reported.  VideoDeclined- This patient declined Librarian, academic. Therefore the visit was completed with audio only.  Persons Participating in Visit: Patient.  AWV Questionnaire: No: Patient Medicare AWV questionnaire was not completed prior to this visit.  Cardiac Risk Factors include: advanced age (>55men, >31 women);diabetes mellitus;hypertension;male gender     Objective:    Today's Vitals   11/03/23 1506  BP: 118/60  Weight: 197 lb (89.4 kg)  Height: 5' 8.5 (1.74 m)  PainSc: 0-No pain   Body mass index is 29.52 kg/m.     11/03/2023    3:08 PM 10/16/2021    9:42 AM 10/12/2021   10:40 AM 12/23/2020    7:49 AM 12/18/2020    1:33 PM 11/29/2019   11:33 AM 11/27/2018    2:53 PM  Advanced Directives  Does Patient Have a Medical Advance Directive? No No Yes No No No No  Type of Advance Directive   Healthcare Power of Attorney      Would patient like information on creating a medical advance directive? Yes (MAU/Ambulatory/Procedural  Areas - Information given) No - Patient declined  No - Patient declined No - Patient declined No - Patient declined No - Patient declined    Current Medications (verified) Outpatient Encounter Medications as of 11/03/2023  Medication Sig   acetaminophen  (TYLENOL ) 500 MG tablet Take 1,000 mg by mouth every 6 (six) hours as needed for mild pain or moderate pain.    ALPRAZolam  (XANAX ) 0.5 MG tablet Take 0.5 mg by mouth 3 (three) times daily as needed for anxiety.    amLODipine  (NORVASC ) 10 MG tablet Take 10 mg by mouth daily.   aspirin  EC 81 MG tablet Take 1 tablet (81 mg total) by mouth daily. Swallow whole.   atenolol  (TENORMIN ) 50 MG tablet Take 50 mg by mouth daily.   cloNIDine  (CATAPRES ) 0.2 MG tablet Take 0.2 mg by mouth 2 (two) times daily.   empagliflozin (JARDIANCE) 10 MG TABS tablet Take 10 mg by mouth daily.   fluticasone (FLONASE) 50 MCG/ACT nasal spray Place 1 spray into both nostrils daily as needed for allergies or rhinitis.   gabapentin (NEURONTIN) 300 MG capsule Take 300 mg by mouth 3 (three) times daily.   Levothyroxine Sodium 25 MCG CAPS Take 25 mcg by mouth daily before breakfast.   linaclotide  (LINZESS ) 72 MCG capsule Take 72 mcg by mouth daily before breakfast. PRN per pt   losartan (COZAAR) 100 MG tablet Take 100 mg by mouth daily.   metFORMIN (GLUCOPHAGE) 850 MG tablet Take 850 mg by mouth 2 (two) times daily with a meal.   ofloxacin  (  FLOXIN ) 0.3 % OTIC solution Place 5 drops into the right ear daily.   omeprazole (PRILOSEC) 20 MG capsule Take 20 mg by mouth daily.   polyethylene glycol (MIRALAX / GLYCOLAX) 17 g packet Take 17 g by mouth daily. 1 capful daily   rosuvastatin  (CRESTOR ) 20 MG tablet Take 20 mg by mouth at bedtime.    spironolactone (ALDACTONE) 25 MG tablet Take 12.5 mg by mouth daily.   traZODone (DESYREL) 100 MG tablet Take 100 mg by mouth at bedtime.   Facility-Administered Encounter Medications as of 11/03/2023  Medication   sodium chloride  flush (NS)  0.9 % injection 3 mL    Allergies (verified) Atorvastatin, Pravastatin, Ramipril, Simvastatin, Allegra [fexofenadine], and Keflex [cephalexin]   History: Past Medical History:  Diagnosis Date   Anxiety    Aortic atherosclerosis    BPH (benign prostatic hyperplasia)    Cervical spondylosis    CKD (chronic kidney disease) stage 3, GFR 30-59 ml/min (HCC)    Essential hypertension    Hypothyroidism    Mixed hyperlipidemia    Osteoarthritis    Type 2 diabetes mellitus (HCC)    Past Surgical History:  Procedure Laterality Date   BACK SURGERY     2014. Has had four back surgeryes total   BIOPSY  12/23/2020   Procedure: BIOPSY;  Surgeon: Eartha Angelia Sieving, MD;  Location: [facility];  Service: Gastroenterology;;   COLONOSCOPY N/A 02/26/2015   Procedure: COLONOSCOPY;  Surgeon: Claudis RAYMOND Rivet, MD;  Location: [facility];  Service: Endoscopy;  Laterality: N/A;  1:00   COLONOSCOPY N/A 11/29/2019   Rehman: melanosis of colon, external/internal hemorrhoids, no specimens   ESOPHAGOGASTRODUODENOSCOPY (EGD) WITH PROPOFOL  N/A 12/23/2020   Procedure: ESOPHAGOGASTRODUODENOSCOPY (EGD) WITH PROPOFOL ;  Surgeon: Eartha Angelia Sieving, MD;  Location: [facility];  Service: Gastroenterology;  Laterality: N/A;  8:15   KNEE SURGERY     Left arthro   LEFT HEART CATH AND CORONARY ANGIOGRAPHY N/A 10/16/2021   Procedure: LEFT HEART CATH AND CORONARY ANGIOGRAPHY;  Surgeon: Burnard Debby LABOR, MD;  Location: [facility];  Service: Cardiovascular;  Laterality: N/A;   Family History  Problem Relation Age of Onset   Stroke Mother    Hypertension Mother    Pancreatic cancer Father    Stroke Maternal Grandmother    Ulcerative colitis Paternal Grandmother    Social History   Socioeconomic History   Marital status: Married    Spouse name: Not on file   Number of children: Not on file   Years of education: Not on file   Highest education level: Not on file  Occupational History   Not  on file  Tobacco Use   Smoking status: Former    Current packs/day: 0.00    Average packs/day: 1 pack/day for 22.0 years (22.0 ttl pk-yrs)    Types: Cigarettes    Start date: 72    Quit date: 14    Years since quitting: 35.8    Passive exposure: Past   Smokeless tobacco: Never   Tobacco comments:    quit in 1990  Vaping Use   Vaping status: Never Used  Substance and Sexual Activity   Alcohol use: No    Alcohol/week: 0.0 standard drinks of alcohol   Drug use: No   Sexual activity: Not on file  Other Topics Concern   Not on file  Social History Narrative   Not on file   Social Drivers of Health   Financial Resource Strain: Low Risk  (  11/03/2023)   Overall Financial Resource Strain (CARDIA)    Difficulty of Paying Living Expenses: Not hard at all  Food Insecurity: No Food Insecurity (11/03/2023)   Hunger Vital Sign    Worried About Running Out of Food in the Last Year: Never true    Ran Out of Food in the Last Year: Never true  Transportation Needs: No Transportation Needs (11/03/2023)   PRAPARE - Administrator, Civil Service (Medical): No    Lack of Transportation (Non-Medical): No  Physical Activity: Sufficiently Active (11/03/2023)   Exercise Vital Sign    Days of Exercise per Week: 7 days    Minutes of Exercise per Session: 30 min  Stress: No Stress Concern Present (11/03/2023)   [employer] of Occupational Health - Occupational Stress Questionnaire    Feeling of Stress: Not at all  Social Connections: Moderately Integrated (11/03/2023)   Social Connection and Isolation Panel    Frequency of Communication with Friends and Family: More than three times a week    Frequency of Social Gatherings with Friends and Family: Once a week    Attends Religious Services: More than 4 times per year    Active Member of [employer] or Organizations: No    Attends Engineer, structural: Never    Marital Status: Married    Tobacco Counseling Counseling  given: Yes Tobacco comments: quit in 1990    Clinical Intake:  Pre-visit preparation completed: Yes  Pain : No/denies pain Pain Score: 0-No pain     BMI - recorded: 29.52 Nutritional Status: BMI 25 -29 Overweight Nutritional Risks: None Diabetes: Yes CBG done?: No (telehealth visit) Did pt. bring in CBG monitor from home?: No  Lab Results  Component Value Date   HGBA1C 7.5 05/05/2023   HGBA1C 6.8 (H) 11/27/2018     How often do you need to have someone help you when you read instructions, pamphlets, or other written materials from your doctor or pharmacy?: 1 - Never  Interpreter Needed?: No  Information entered by :: Joycie Aerts W CMA (AAMA)   Activities of Daily Living     11/03/2023    3:06 PM  In your present state of health, do you have any difficulty performing the following activities:  Hearing? 0  Vision? 0  Difficulty concentrating or making decisions? 0  Walking or climbing stairs? 0  Dressing or bathing? 0  Doing errands, shopping? 0  Preparing Food and eating ? N  Using the Toilet? N  In the past six months, have you accidently leaked urine? N  Do you have problems with loss of bowel control? N  Managing your Medications? N  Managing your Finances? N  Housekeeping or managing your Housekeeping? N    Patient Care Team: Tobie Suzzane POUR, MD as PCP - General (Internal Medicine) Debera Jayson MATSU, MD as PCP - Cardiology (Cardiology)  I have updated your Care Teams any recent Medical Services you may have received from other providers in the past year.     Assessment:   This is a routine wellness examination for Byers.  Hearing/Vision screen Hearing Screening - Comments:: Patient denies any hearing difficulties.   Vision Screening - Comments:: Wears rx glasses - up to date with routine eye exams with  [facility]   Goals Addressed               This Visit's Progress     I want to stay healthy (pt-stated)  Depression Screen      11/03/2023    3:09 PM 08/25/2023    3:23 PM  PHQ 2/9 Scores  PHQ - 2 Score 0 0  PHQ- 9 Score 0 0     Fall Risk     11/03/2023    3:09 PM 08/25/2023    3:22 PM  Fall Risk   Falls in the past year? 0 0  Number falls in past yr: 0 0  Injury with Fall? 0 0  Risk for fall due to : No Fall Risks No Fall Risks  Follow up Falls evaluation completed;Education provided;Falls prevention discussed Falls evaluation completed    MEDICARE RISK AT HOME:  Medicare Risk at Home Any stairs in or around the home?: Yes If so, are there any without handrails?: No Home free of loose throw rugs in walkways, pet beds, electrical cords, etc?: Yes Adequate lighting in your home to reduce risk of falls?: Yes Life alert?: No Use of a cane, walker or w/c?: No Grab bars in the bathroom?: Yes Shower chair or bench in shower?: Yes Elevated toilet seat or a handicapped toilet?: Yes  TIMED UP AND GO:  Was the test performed?  No  Cognitive Function: 6CIT completed        11/03/2023    3:09 PM  6CIT Screen  What Year? 0 points  What month? 0 points  What time? 0 points  Count back from 20 0 points  Months in reverse 0 points  Repeat phrase 0 points  Total Score 0 points    Immunizations Immunization History  Administered Date(s) Administered   INFLUENZA, HIGH DOSE SEASONAL PF 10/24/2023   Moderna Sars-Covid-2 Vaccination 02/09/2019, 11/14/2019, 05/06/2020, 11/05/2020   Tdap 08/06/2009    Screening Tests Health Maintenance  Topic Date Due   OPHTHALMOLOGY EXAM  Never done   DTaP/Tdap/Td (2 - Td or Tdap) 08/07/2019   Diabetic kidney evaluation - eGFR measurement  10/13/2022   COVID-19 Vaccine (11 - 2025-26 season) 09/19/2023   HEMOGLOBIN A1C  11/04/2023   Diabetic kidney evaluation - Urine ACR  08/24/2024   FOOT EXAM  08/24/2024   Medicare Annual Wellness (AWV)  11/02/2024   Pneumococcal Vaccine: 50+ Years  Completed   Influenza Vaccine  Completed   Hepatitis C Screening  Completed    Zoster Vaccines- Shingrix  Completed   Meningococcal B Vaccine  Aged Out   Colonoscopy  Discontinued    Health Maintenance Health Maintenance Due  Topic Date Due   OPHTHALMOLOGY EXAM  Never done   DTaP/Tdap/Td (2 - Td or Tdap) 08/07/2019   Diabetic kidney evaluation - eGFR measurement  10/13/2022   COVID-19 Vaccine (11 - 2025-26 season) 09/19/2023   Health Maintenance Items Addressed: Last eye exam requested. Most recent labs abstracted into chart  Additional Screening:  Vision Screening: Recommended annual ophthalmology exams for early detection of glaucoma and other disorders of the eye. Would you like a referral to an eye doctor? No    Dental Screening: Recommended annual dental exams for proper oral hygiene  Community Resource Referral / Chronic Care Management: CRR required this visit?  No   CCM required this visit?  No   Plan:    I have personally reviewed and noted the following in the patient's chart:   Medical and social history Use of alcohol, tobacco or illicit drugs  Current medications and supplements including opioid prescriptions. Patient is not currently taking opioid prescriptions. Functional ability and status Nutritional status Physical activity Advanced directives List  of other physicians Hospitalizations, surgeries, and ER visits in previous 12 months Vitals Screenings to include cognitive, depression, and falls Referrals and appointments  In addition, I have reviewed and discussed with patient certain preventive protocols, quality metrics, and best practice recommendations. A written personalized care plan for preventive services as well as general preventive health recommendations were provided to patient.   Tran Randle, CMA   11/03/2023   After Visit Summary: (Mail) Due to this being a telephonic visit, the after visit summary with patients personalized plan was offered to patient via mail   Notes: Nothing significant to report at this  time.

## 2023-12-02 ENCOUNTER — Encounter (INDEPENDENT_AMBULATORY_CARE_PROVIDER_SITE_OTHER): Payer: Self-pay | Admitting: *Deleted

## 2023-12-26 ENCOUNTER — Encounter: Payer: Self-pay | Admitting: Internal Medicine

## 2023-12-26 ENCOUNTER — Ambulatory Visit: Admitting: Internal Medicine

## 2023-12-26 VITALS — BP 116/61 | HR 73 | Ht 68.0 in | Wt 193.6 lb

## 2023-12-26 DIAGNOSIS — E538 Deficiency of other specified B group vitamins: Secondary | ICD-10-CM | POA: Diagnosis not present

## 2023-12-26 DIAGNOSIS — N1832 Chronic kidney disease, stage 3b: Secondary | ICD-10-CM | POA: Diagnosis not present

## 2023-12-26 DIAGNOSIS — F411 Generalized anxiety disorder: Secondary | ICD-10-CM

## 2023-12-26 DIAGNOSIS — K219 Gastro-esophageal reflux disease without esophagitis: Secondary | ICD-10-CM

## 2023-12-26 DIAGNOSIS — I2510 Atherosclerotic heart disease of native coronary artery without angina pectoris: Secondary | ICD-10-CM | POA: Diagnosis not present

## 2023-12-26 DIAGNOSIS — Z7984 Long term (current) use of oral hypoglycemic drugs: Secondary | ICD-10-CM

## 2023-12-26 DIAGNOSIS — E1169 Type 2 diabetes mellitus with other specified complication: Secondary | ICD-10-CM

## 2023-12-26 DIAGNOSIS — I10 Essential (primary) hypertension: Secondary | ICD-10-CM

## 2023-12-26 MED ORDER — OMEPRAZOLE 40 MG PO CPDR: 40.0000 mg | DELAYED_RELEASE_CAPSULE | Freq: Every day | ORAL | 1 refills | Status: AC

## 2023-12-26 MED ORDER — CYANOCOBALAMIN 1000 MCG/ML IJ SOLN
1000.0000 ug | Freq: Once | INTRAMUSCULAR | Status: AC
  Administered 2023-12-26: 1000 ug via INTRAMUSCULAR

## 2023-12-26 MED ORDER — ALPRAZOLAM 0.5 MG PO TABS: 0.5000 mg | ORAL_TABLET | Freq: Three times a day (TID) | ORAL | 3 refills | Status: AC | PRN

## 2023-12-26 NOTE — Progress Notes (Unsigned)
 Established Patient Office Visit  Subjective:  Patient ID: Joseph Wolf, male    DOB: 1945/04/24  Age: 78 y.o. MRN: [id number]  CC:  Chief Complaint  Patient presents with   Hypertension    Follow up, would like his b12 shot today   Anxiety    Follow up, needs refill on alprazolam .    Abdominal Pain    Concerns about indigestion, pain at the top of his stomach. On and off last happened yesterday.     HPI Joseph Wolf is a 78 y.o. male with past medical history of HTN, CAD, GERD, type II DM, CKD, GAD and allergic rhinitis who presents for f/u of his chronic medical conditions.  HTN: His BP was wnl today, and reports home BP in 120s-130s/70s.  He takes amlodipine  10 mg QD, atenolol  50 mg QD, clonidine  0.2 mg twice daily, losartan 100 mg QD and spironolactone 12.5 mg QD.  He is followed by cardiology, nephrology and [facility] as well.  Denies any headache, dizziness, chest pain, dyspnea or palpitations currently.  Type II DM: He takes metformin 850 mg twice daily currently.  He was taking Jardiance in the past, but has stopped taking it recently as he thought he had rash due to it.  Of note, he had taken Jardiance for about a year before stopping it.  He checks blood glucose regularly, and reports them being around 120s-140s in the morning.  Denies polyuria or polydipsia currently.  CKD: Followed by Dr. Rachele.  Denies dysuria, hematuria, urinary hesitancy or resistance.  GAD: He takes Xanax  0.5 mg 3 times daily as needed for a long time.  Denies anhedonia, panic episodes, SI or HI currently.  Allergic rhinitis: He reports chronic nasal congestion, and has Flonase for it.  He also reports right ear irritation for the last 2 weeks, which has been recurrent in the past.  He reports remote baseball injury to the right ear.  He has pain behind the right ear currently, but denies ear discharge.  Denies fever or chills.  Past Medical History:  Diagnosis Date   Anxiety    Aortic  atherosclerosis    BPH (benign prostatic hyperplasia)    Cervical spondylosis    CKD (chronic kidney disease) stage 3, GFR 30-59 ml/min (HCC)    Essential hypertension    Hypothyroidism    Mixed hyperlipidemia    Osteoarthritis    Type 2 diabetes mellitus (HCC)     Past Surgical History:  Procedure Laterality Date   BACK SURGERY     2014. Has had four back surgeryes total   BIOPSY  12/23/2020   Procedure: BIOPSY;  Surgeon: Eartha Angelia Sieving, MD;  Location: [facility];  Service: Gastroenterology;;   COLONOSCOPY N/A 02/26/2015   Procedure: COLONOSCOPY;  Surgeon: Claudis RAYMOND Rivet, MD;  Location: [facility];  Service: Endoscopy;  Laterality: N/A;  1:00   COLONOSCOPY N/A 11/29/2019   Rehman: melanosis of colon, external/internal hemorrhoids, no specimens   ESOPHAGOGASTRODUODENOSCOPY (EGD) WITH PROPOFOL  N/A 12/23/2020   Procedure: ESOPHAGOGASTRODUODENOSCOPY (EGD) WITH PROPOFOL ;  Surgeon: Eartha Angelia Sieving, MD;  Location: [facility];  Service: Gastroenterology;  Laterality: N/A;  8:15   KNEE SURGERY     Left arthro   LEFT HEART CATH AND CORONARY ANGIOGRAPHY N/A 10/16/2021   Procedure: LEFT HEART CATH AND CORONARY ANGIOGRAPHY;  Surgeon: Burnard Debby LABOR, MD;  Location: [facility];  Service: Cardiovascular;  Laterality: N/A;    Family History  Problem  Relation Age of Onset   Stroke Mother    Hypertension Mother    Pancreatic cancer Father    Stroke Maternal Grandmother    Ulcerative colitis Paternal Grandmother     Social History   Socioeconomic History   Marital status: Married    Spouse name: Not on file   Number of children: Not on file   Years of education: Not on file   Highest education level: Not on file  Occupational History   Not on file  Tobacco Use   Smoking status: Former    Current packs/day: 0.00    Average packs/day: 1 pack/day for 22.0 years (22.0 ttl pk-yrs)    Types: Cigarettes    Start date: 51    Quit date: 19    Years  since quitting: 35.9    Passive exposure: Past   Smokeless tobacco: Never   Tobacco comments:    quit in 1990  Vaping Use   Vaping status: Never Used  Substance and Sexual Activity   Alcohol use: No    Alcohol/week: 0.0 standard drinks of alcohol   Drug use: No   Sexual activity: Not on file  Other Topics Concern   Not on file  Social History Narrative   Not on file   Social Drivers of Health   Financial Resource Strain: Low Risk  (11/03/2023)   Overall Financial Resource Strain (CARDIA)    Difficulty of Paying Living Expenses: Not hard at all  Food Insecurity: No Food Insecurity (11/03/2023)   Hunger Vital Sign    Worried About Running Out of Food in the Last Year: Never true    Ran Out of Food in the Last Year: Never true  Transportation Needs: No Transportation Needs (11/03/2023)   PRAPARE - Administrator, Civil Service (Medical): No    Lack of Transportation (Non-Medical): No  Physical Activity: Sufficiently Active (11/03/2023)   Exercise Vital Sign    Days of Exercise per Week: 7 days    Minutes of Exercise per Session: 30 min  Stress: No Stress Concern Present (11/03/2023)   [employer] of Occupational Health - Occupational Stress Questionnaire    Feeling of Stress: Not at all  Social Connections: Moderately Integrated (11/03/2023)   Social Connection and Isolation Panel    Frequency of Communication with Friends and Family: More than three times a week    Frequency of Social Gatherings with Friends and Family: Once a week    Attends Religious Services: More than 4 times per year    Active Member of [employer] or Organizations: No    Attends Banker Meetings: Never    Marital Status: Married  Catering Manager Violence: Not At Risk (11/03/2023)   Humiliation, Afraid, Rape, and Kick questionnaire    Fear of Current or Ex-Partner: No    Emotionally Abused: No    Physically Abused: No    Sexually Abused: No    ROS Review of Systems   Constitutional:  Negative for chills and fever.  HENT:  Positive for congestion and ear pain (Right). Negative for sore throat.   Eyes:  Negative for pain and discharge.  Respiratory:  Negative for cough and shortness of breath.   Cardiovascular:  Negative for chest pain and palpitations.  Gastrointestinal:  Positive for constipation. Negative for diarrhea, nausea and vomiting.  Endocrine: Negative for polydipsia and polyuria.  Genitourinary:  Negative for dysuria and hematuria.  Musculoskeletal:  Negative for neck pain and neck stiffness.  Skin:  Negative for rash.  Neurological:  Negative for dizziness, weakness, numbness and headaches.  Psychiatric/Behavioral:  Negative for agitation and behavioral problems. The patient is nervous/anxious.     Objective:   Today's Vitals: BP 116/61   Pulse 73   Ht 5' 8 (1.727 m)   Wt 193 lb 9.6 oz (87.8 kg)   SpO2 98%   BMI 29.44 kg/m   Physical Exam Vitals reviewed.  Constitutional:      General: He is not in acute distress.    Appearance: He is not diaphoretic.  HENT:     Head: Normocephalic and atraumatic.     Right Ear: A middle ear effusion is present.     Left Ear: Ear canal and external ear normal.     Nose: Nose normal.     Mouth/Throat:     Mouth: Mucous membranes are moist.  Eyes:     General: No scleral icterus.    Extraocular Movements: Extraocular movements intact.  Cardiovascular:     Rate and Rhythm: Normal rate and regular rhythm.     Heart sounds: Normal heart sounds. No murmur heard. Pulmonary:     Breath sounds: Normal breath sounds. No wheezing or rales.  Musculoskeletal:     Cervical back: Neck supple. No tenderness.     Right lower leg: No edema.     Left lower leg: No edema.  Skin:    General: Skin is warm.     Findings: No rash.  Neurological:     General: No focal deficit present.     Mental Status: He is alert and oriented to person, place, and time.  Psychiatric:        Mood and Affect: Mood  normal.        Behavior: Behavior normal.     Assessment & Plan:   Problem List Items Addressed This Visit   None    Outpatient Encounter Medications as of 12/26/2023  Medication Sig   acetaminophen  (TYLENOL ) 500 MG tablet Take 1,000 mg by mouth every 6 (six) hours as needed for mild pain or moderate pain.    ALPRAZolam  (XANAX ) 0.5 MG tablet Take 0.5 mg by mouth 3 (three) times daily as needed for anxiety.    amLODipine  (NORVASC ) 10 MG tablet Take 10 mg by mouth daily.   aspirin  EC 81 MG tablet Take 1 tablet (81 mg total) by mouth daily. Swallow whole.   atenolol  (TENORMIN ) 50 MG tablet Take 50 mg by mouth daily.   cloNIDine  (CATAPRES ) 0.2 MG tablet Take 0.2 mg by mouth 2 (two) times daily.   empagliflozin (JARDIANCE) 10 MG TABS tablet Take 10 mg by mouth daily.   fluticasone (FLONASE) 50 MCG/ACT nasal spray Place 1 spray into both nostrils daily as needed for allergies or rhinitis.   gabapentin (NEURONTIN) 300 MG capsule Take 300 mg by mouth 3 (three) times daily.   Levothyroxine Sodium 25 MCG CAPS Take 25 mcg by mouth daily before breakfast.   linaclotide  (LINZESS ) 72 MCG capsule Take 72 mcg by mouth daily before breakfast. PRN per pt   losartan (COZAAR) 100 MG tablet Take 100 mg by mouth daily.   metFORMIN (GLUCOPHAGE) 850 MG tablet Take 850 mg by mouth 2 (two) times daily with a meal.   ofloxacin  (FLOXIN ) 0.3 % OTIC solution Place 5 drops into the right ear daily.   omeprazole  (PRILOSEC) 20 MG capsule Take 20 mg by mouth daily.   polyethylene glycol (MIRALAX / GLYCOLAX) 17 g packet Take 17 g by  mouth daily. 1 capful daily   rosuvastatin  (CRESTOR ) 20 MG tablet Take 20 mg by mouth at bedtime.    spironolactone (ALDACTONE) 25 MG tablet Take 12.5 mg by mouth daily.   traZODone (DESYREL) 100 MG tablet Take 100 mg by mouth at bedtime.   Facility-Administered Encounter Medications as of 12/26/2023  Medication   sodium chloride  flush (NS) 0.9 % injection 3 mL    Follow-up: No  follow-ups on file.   Suzzane MARLA Blanch, MD

## 2023-12-26 NOTE — Assessment & Plan Note (Signed)
 Lab Results  Component Value Date   HGBA1C 8.1 09/23/2023   Overall well-controlled for his age Associated with HTN, HLD, CAD and neuropathy On metformin 850 mg twice daily Advised to restart taking Jardiance 12.5 mg (half tablet of 25 mg) Advised to follow diabetic diet On statin and ARB F/u CMP and lipid panel - gets blood tests at [facility] clinic Diabetic eye exam: Advised to follow up with Ophthalmology for diabetic eye exam  Takes gabapentin 300 mg 3 times daily for neuropathy

## 2023-12-26 NOTE — Patient Instructions (Signed)
 Please start taking Omeprazole  40 mg once daily for now.  Please continue to take medications as prescribed.  Please continue to follow low carb diet and perform moderate exercise/walking as tolerated.

## 2023-12-29 NOTE — Assessment & Plan Note (Addendum)
 Well-controlled with Xanax  0.5 mg 3 times daily as needed Has been on Xanax  for many years, reviewed PDMP - refilled

## 2023-12-29 NOTE — Assessment & Plan Note (Signed)
 Gets B12 injections monthly B12 injection today

## 2023-12-29 NOTE — Assessment & Plan Note (Signed)
 Currently denies chest pain Followed by cardiology On amlodipine , atenolol , losartan and spironolactone

## 2023-12-29 NOTE — Assessment & Plan Note (Signed)
 Last BMP reviewed, GFR stays around 35-45, recently better at 50 Followed by nephrology-Dr. Rachele Maintain adequate hydration On losartan and Jardiance currently Checked urine microalbumin/creatinine ratio

## 2023-12-29 NOTE — Assessment & Plan Note (Signed)
 BP Readings from Last 1 Encounters:  12/26/23 116/61   Well-controlled at home with amlodipine  10 mg QD, atenolol  50 mg QD, clonidine  0.2 mg BID, losartan 100 mg QD and spironolactone 12.5 mg QD Counseled for compliance with the medications Advised DASH diet and moderate exercise/walking as tolerated

## 2023-12-29 NOTE — Assessment & Plan Note (Signed)
 Uncontrolled with omeprazole  20 mg QD Epigastric pain and indigestion likely due to worsening of GERD Increased dose of omeprazole  to 40 mg QD

## 2024-01-23 ENCOUNTER — Ambulatory Visit (INDEPENDENT_AMBULATORY_CARE_PROVIDER_SITE_OTHER)

## 2024-01-23 DIAGNOSIS — E538 Deficiency of other specified B group vitamins: Secondary | ICD-10-CM | POA: Diagnosis not present

## 2024-01-23 MED ORDER — CYANOCOBALAMIN 1000 MCG/ML IJ SOLN
1000.0000 ug | Freq: Once | INTRAMUSCULAR | Status: AC
  Administered 2024-01-23: 1000 ug via INTRAMUSCULAR

## 2024-01-23 NOTE — Progress Notes (Signed)
 Patient is in office today for a nurse visit for B12 Injection. Patient Injection was given in the  Left arm. Patient tolerated injection well.

## 2024-02-23 ENCOUNTER — Encounter: Payer: Self-pay | Admitting: Cardiology

## 2024-02-23 ENCOUNTER — Ambulatory Visit: Admitting: Cardiology

## 2024-02-23 VITALS — BP 120/76 | HR 88 | Ht 68.0 in | Wt 194.4 lb

## 2024-02-23 DIAGNOSIS — I10 Essential (primary) hypertension: Secondary | ICD-10-CM

## 2024-02-23 DIAGNOSIS — N1832 Chronic kidney disease, stage 3b: Secondary | ICD-10-CM

## 2024-02-23 DIAGNOSIS — I2510 Atherosclerotic heart disease of native coronary artery without angina pectoris: Secondary | ICD-10-CM

## 2024-02-23 DIAGNOSIS — E782 Mixed hyperlipidemia: Secondary | ICD-10-CM | POA: Diagnosis not present

## 2024-02-23 NOTE — Progress Notes (Signed)
"  ° ° °  Cardiology Office Note  Date: 02/23/2024   ID: Joseph Wolf, Joseph Wolf January 06, 1946, MRN [id number]  History of Present Illness: Joseph Wolf is a 79 y.o. male last seen in November 2024.  He is here for a routine visit.  He does not report any exertional chest pain or specific change in stamina.  Stable NYHA class II dyspnea, no syncope.  We went over his medications, he reports compliance with therapy and continues to check blood pressure regularly at home.  I went over his most recent lab work which is noted below.  He is due to see Dr. Tobie for primary care follow-up this year, will also have a visit through the [facility] system.  He is due for follow-up lipids this year.  I reviewed his ECG today which shows sinus rhythm with borderline prolonged PR interval and right bundle branch block, nonspecific ST-T changes.  Physical Exam: VS:  BP 120/76 (BP Location: Left Arm, Patient Position: Sitting, Cuff Size: Normal)   Pulse 88   Ht 5' 8 (1.727 m)   Wt 194 lb 6.4 oz (88.2 kg)   SpO2 97%   BMI 29.56 kg/m , BMI Body mass index is 29.56 kg/m.  Wt Readings from Last 3 Encounters:  02/23/24 194 lb 6.4 oz (88.2 kg)  12/26/23 193 lb 9.6 oz (87.8 kg)  11/03/23 197 lb (89.4 kg)    General: Patient appears comfortable at rest. HEENT: Conjunctiva and lids normal. Neck: Supple, no elevated JVP or carotid bruits. Lungs: Clear to auscultation, nonlabored breathing at rest. Cardiac: Regular rate and rhythm, no S3 or significant systolic murmur. Extremities: No pitting edema.  ECG:  An ECG dated 12/08/2022 was personally reviewed today and demonstrated:  Sinus rhythm with right bundle branch block and nonspecific ST-T changes.  Labwork: 09/21/2023: Creatinine 1.24 January 2024: Hemoglobin A1c 7.8%, BUN 18, creatinine 1.48, GFR 48, potassium 4.6, hemoglobin 14.9, platelets 264  Other Studies Reviewed Today:  Cardiac testing for review today.  Assessment and Plan:  1.  Mild nonobstructive  CAD documented at cardiac catheterization in September 2023.  He remains asymptomatic with plan to continue medical therapy.  ECG reviewed.  Continue aspirin  81 mg daily, Jardiance 12.5 mg daily, and Crestor  20 mg daily.   2.  Primary hypertension.  Blood pressure is well-controlled today.  Continue Aldactone 12.5 mg daily, Cozaar 100 mg daily, clonidine  0.2 mg twice daily, Norvasc  10 mg daily, and atenolol  50 mg daily.  Keep follow-up with Dr. Tobie.   3.  Mixed hyperlipidemia.  LDL 45 in August 2024.  He remains on Crestor  20 mg daily.  Due for repeat FLP this year, he will see Dr. Tobie and also [facility] system.   4.  CKD stage IIIb.  Creatinine 1.48 with GFR 48 in January.  He continues to follow with nephrology.  Disposition:  Follow up 1 year.  Signed, Jayson JUDITHANN Sierras, M.D., F.A.C.C. [facility] "

## 2024-02-23 NOTE — Patient Instructions (Addendum)

## 2024-02-28 ENCOUNTER — Ambulatory Visit: Payer: Self-pay

## 2024-04-25 ENCOUNTER — Ambulatory Visit: Admitting: Internal Medicine

## 2024-11-06 ENCOUNTER — Ambulatory Visit
